# Patient Record
Sex: Female | Born: 2000 | Race: White | Hispanic: No | Marital: Single | Smoking: Never smoker
Health system: Southern US, Community
[De-identification: ages and names within clinical notes are randomized; demographics above are authoritative.]

## PROBLEM LIST (undated history)

## (undated) DIAGNOSIS — N926 Irregular menstruation, unspecified: Secondary | ICD-10-CM

## (undated) DIAGNOSIS — F329 Major depressive disorder, single episode, unspecified: Secondary | ICD-10-CM

## (undated) DIAGNOSIS — F909 Attention-deficit hyperactivity disorder, unspecified type: Secondary | ICD-10-CM

## (undated) DIAGNOSIS — F419 Anxiety disorder, unspecified: Secondary | ICD-10-CM

## (undated) DIAGNOSIS — K529 Noninfective gastroenteritis and colitis, unspecified: Secondary | ICD-10-CM

## (undated) DIAGNOSIS — F32A Depression, unspecified: Secondary | ICD-10-CM

## (undated) DIAGNOSIS — K2950 Unspecified chronic gastritis without bleeding: Secondary | ICD-10-CM

## (undated) DIAGNOSIS — R0683 Snoring: Secondary | ICD-10-CM

## (undated) DIAGNOSIS — E669 Obesity, unspecified: Secondary | ICD-10-CM

## (undated) DIAGNOSIS — K2980 Duodenitis without bleeding: Secondary | ICD-10-CM

## (undated) HISTORY — DX: Duodenitis without bleeding: K29.80

## (undated) HISTORY — DX: Snoring: R06.83

## (undated) HISTORY — DX: Noninfective gastroenteritis and colitis, unspecified: K52.9

## (undated) HISTORY — DX: Obesity, unspecified: E66.9

## (undated) HISTORY — DX: Major depressive disorder, single episode, unspecified: F32.9

## (undated) HISTORY — DX: Irregular menstruation, unspecified: N92.6

## (undated) HISTORY — DX: Attention-deficit hyperactivity disorder, unspecified type: F90.9

## (undated) HISTORY — DX: Depression, unspecified: F32.A

## (undated) HISTORY — DX: Anxiety disorder, unspecified: F41.9

## (undated) HISTORY — DX: Unspecified chronic gastritis without bleeding: K29.50

---
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
## 2019-01-07 ENCOUNTER — Telehealth: Payer: Managed Care, Other (non HMO) | Admitting: Family Medicine

## 2019-01-07 ENCOUNTER — Telehealth (INDEPENDENT_AMBULATORY_CARE_PROVIDER_SITE_OTHER): Payer: Managed Care, Other (non HMO) | Admitting: Family Medicine

## 2019-01-07 ENCOUNTER — Encounter: Payer: Self-pay | Admitting: Family Medicine

## 2019-01-07 VITALS — Wt 130.0 lb

## 2019-01-07 DIAGNOSIS — F900 Attention-deficit hyperactivity disorder, predominantly inattentive type: Secondary | ICD-10-CM | POA: Diagnosis not present

## 2019-01-07 MED ORDER — AMPHETAMINE-DEXTROAMPHETAMINE 10 MG PO TABS: 10.0000 mg | ORAL_TABLET | Freq: Two times a day (BID) | ORAL | 0 refills | Status: DC

## 2019-01-07 NOTE — Patient Instructions (Addendum)
Thank you for coming in today. Send me a mychart message in about 2 weeks with an update on how the new dose of adderall is going.  If we need to adjust the dose up or days. If all is well plan for repeat video visit with [facility] or other in 3 months.

## 2019-01-07 NOTE — Progress Notes (Signed)
Virtual Visit  via Video Note  I connected with      Sherry Yang by a video enabled telemedicine application and verified that I am speaking with the correct person using two identifiers.   I discussed the limitations of evaluation and management by telemedicine and the availability of in person appointments. The patient expressed understanding and agreed to proceed.  History of Present Illness: Sherry Yang is a 18 y.o. female who would like to discuss ADHD.  Patient has a history of ADHD managed with Vyvanse 30.  She notes his medication helps quite a bit but is very expensive.  She has to pay about $200 a month for this medication.  She notes that when she takes Vyvanse usually last about 8 hours.  She currently is attending college at [employer]  She is not had trials of other medications.  She is interested in possibly Adderall.     Observations/Objective: Wt 130 lb (59 kg)   BMI 22.31 kg/m  Wt Readings from Last 5 Encounters:  01/07/19 130 lb (59 kg) (55 %, Z= 0.14)*  11/30/18 128 lb (58.1 kg) (52 %, Z= 0.06)*  11/28/18 132 lb (59.9 kg) (59 %, Z= 0.24)*  11/06/18 130 lb (59 kg) (56 %, Z= 0.16)*  08/28/18 128 lb (58.1 kg) (53 %, Z= 0.09)*   * Growth percentiles are based on CDC (Girls, 2-20 Years) data.   Exam: Appearance nontoxic appearing Normal Speech.  Alert and oriented normal speech thought process.   Assessment and Plan: 19 y.o. female with ADHD.  Doing well with Vyvanse but it is too expensive.  Given her somewhat fragmented schedule as a college student she may benefit from immediate release form of Adderall.  Will try starting at 10 mg Adderall twice daily.  Patient will report back via mychart message in about 2 weeks.  Will adjust dose from there.  Hopefully will be able to refill this medication for total of 3 months.  She does have a follow-up appointment scheduled with Iran Planas PA on November 23.  She is seen Jade before and would  like to establish with her his PCP if able.   PDMP not reviewed this encounter. No orders of the defined types were placed in this encounter.  No orders of the defined types were placed in this encounter.   Follow Up Instructions:    I discussed the assessment and treatment plan with the patient. The patient was provided an opportunity to ask questions and all were answered. The patient agreed with the plan and demonstrated an understanding of the instructions.   The patient was advised to call back or seek an in-person evaluation if the symptoms worsen or if the condition fails to improve as anticipated.  Time: 15 minutes of intraservice time, with >22 minutes of total time during today's visit.      Historical information moved to improve visibility of documentation.  Past Medical History:  Diagnosis Date  . ADHD   . Anxiety   . Chronic gastritis   . Depression   . Duodenitis determined by biopsy   . Inflammatory bowel disease   . Irregular menses   . Obesity, pediatric   . Snoring    Past Surgical History:  Procedure Laterality Date  . COLONOSCOPY WITH ESOPHAGOGASTRODUODENOSCOPY (EGD)  05/2015   Social History   Tobacco Use  . Smoking status: Never Smoker  . Smokeless tobacco: Never Used  Substance Use Topics  . Alcohol  use: No   family history includes Colon polyps in her mother; Hypertension in her father; Irritable bowel syndrome in her father.  Medications: Current Outpatient Medications  Medication Sig Dispense Refill  . clindamycin (CLINDAGEL) 1 % gel Apply topically 2 (two) times daily.    Marland Kitchen ibuprofen (ADVIL) 800 MG tablet Take 1 tablet (800 mg total) by mouth every 8 (eight) hours as needed. 60 tablet 0  . lisdexamfetamine (VYVANSE) 30 MG capsule Take 1 capsule (30 mg total) by mouth daily. 30 capsule 0  . Peppermint Oil 50 MG CPDR Take by mouth.     No current facility-administered medications for this visit.    No Known Allergies

## 2019-02-01 ENCOUNTER — Telehealth: Payer: Self-pay | Admitting: Physician Assistant

## 2019-02-01 NOTE — Telephone Encounter (Signed)
Patient mom has called and the patient was switching form Vyvanse to Adderall and per mom the patient does not feel the 15 mg of adderall is working. She has virtual with you on 11/23. Mom is requesting a sooner appointment. Is this ok to change?   Mom is also requesting about an emotional support dog. Can you do this? Please advise.

## 2019-02-01 NOTE — Telephone Encounter (Signed)
Patient mom is aware that she will have a virtual visit on 02/02/2019 and 02/22/2019 will be cancelled.

## 2019-02-01 NOTE — Telephone Encounter (Signed)
[place] for virtual visit earlier so I can talk about both.

## 2019-02-02 ENCOUNTER — Ambulatory Visit (INDEPENDENT_AMBULATORY_CARE_PROVIDER_SITE_OTHER): Payer: Managed Care, Other (non HMO) | Admitting: Physician Assistant

## 2019-02-02 VITALS — Ht 64.0 in | Wt 130.0 lb

## 2019-02-02 DIAGNOSIS — F419 Anxiety disorder, unspecified: Secondary | ICD-10-CM

## 2019-02-02 DIAGNOSIS — F331 Major depressive disorder, recurrent, moderate: Secondary | ICD-10-CM

## 2019-02-02 DIAGNOSIS — F900 Attention-deficit hyperactivity disorder, predominantly inattentive type: Secondary | ICD-10-CM | POA: Diagnosis not present

## 2019-02-02 MED ORDER — AMPHETAMINE-DEXTROAMPHET ER 20 MG PO CP24: 20.0000 mg | ORAL_CAPSULE | ORAL | 0 refills | Status: DC

## 2019-02-02 NOTE — Progress Notes (Signed)
Patient ID: Sherry Yang, female   DOB: 02-14-2001, 18 y.o.   MRN: [id number] .Marland KitchenVirtual Visit via Telephone Note  I connected with Latanza Pfefferkorn Heckart on 02/02/19 at 10:50 AM EST by telephone and verified that I am speaking with the correct person using two identifiers.  Location: Patient: home Provider: clinic   I discussed the limitations, risks, security and privacy concerns of performing an evaluation and management service by telephone and the availability of in person appointments. I also discussed with the patient that there may be a patient responsible charge related to this service. The patient expressed understanding and agreed to proceed.   History of Present Illness: Patient is a 18 year old female with ADHD, depression, anxiety who presents to the clinic for follow-up.  She was recently seen by Dr. Denyse Amass who switched her from Vyvanse to Adderall due to cost issues.  She is not liking the immediate release Adderall.  She feels like it is creating more anxiety, difficulty sleeping, rashes.  She admits her anxiety was going on right before the medication change as well.  During the Covid pandemic she only has 1 in person class and she is at home by herself in her apartment for the other time.  She is becoming really lonely.  She would like to see about getting a support animal.  At home [place] lots of animals [place].  Here at school she does not have any support.  She denies any suicidal thoughts or homicidal idealizations.  She has been tried on anxiety medication in the past and she did not like the way it made her feel.  She admits she is not being active right now but she thinks if she gets a dog she will be more motivated to go for walks.   .. Active Ambulatory Problems    Diagnosis Date Noted  . Gastritis without bleeding 08/16/2015  . Duodenitis 07/05/2015  . IBD (inflammatory bowel disease) 03/30/2014  . Irregular menses 11/26/2014  . Moderate episode of recurrent  major depressive disorder (HCC) 11/26/2014  . Attention deficit hyperactivity disorder (ADHD), predominantly inattentive type 11/20/2016  . Chronic midline thoracic back pain 11/20/2016  . History of childhood obesity 11/20/2016  . History of syncope 03/07/2017  . Postural dizziness with near syncope 03/07/2017  . Acne vulgaris 03/07/2017  . Need for HPV vaccination 06/25/2017  . Axillary adenopathy 11/30/2018  . Anxiety 02/03/2019   Resolved Ambulatory Problems    Diagnosis Date Noted  . Esophagitis 07/05/2015  . Obesity 08/16/2015  . Encounter for medication management in attention deficit hyperactivity disorder (ADHD) 03/04/2017   Past Medical History:  Diagnosis Date  . ADHD   . Chronic gastritis   . Depression   . Duodenitis determined by biopsy   . Inflammatory bowel disease   . Obesity, pediatric   . Snoring    Reviewed med, allergy, problem list.    Observations/Objective: No acute distress. Normal judgement and conversation.   .. Today's Vitals   02/02/19 1002  Weight: 130 lb (59 kg)  Height: 5\' 4"  (1.626 m)   Body mass index is 22.31 kg/m.   .. Depression screen [facility] 2/9 02/02/2019 08/28/2018 06/25/2017 11/20/2016  Decreased Interest 0 0 0 3  Down, Depressed, Hopeless 0 0 0 1  PHQ - 2 Score 0 0 0 4  Altered sleeping - 0 1 2  Tired, decreased energy - 0 0 1  Change in appetite - 0 0 0  Feeling bad or  failure about yourself  - 0 0 0  Trouble concentrating - 0 0 0  Moving slowly or fidgety/restless - 0 0 0  Suicidal thoughts - 0 0 0  PHQ-9 Score - 0 1 7  Difficult doing work/chores - - Not difficult at all -   .. GAD 7 : Generalized Anxiety Score 02/02/2019 08/28/2018  Nervous, Anxious, on Edge 3 0  Control/stop worrying 2 0  Worry too much - different things 3 0  Trouble relaxing 3 0  Restless 3 0  Easily annoyed or irritable 2 0  Afraid - awful might happen 0 0  Total GAD 7 Score 16 0  Anxiety Difficulty Not difficult at all -      Assessment  and Plan: Marland KitchenMarland KitchenMakynlee was seen today for adhd and anxiety.  Diagnoses and all orders for this visit:  Moderate episode of recurrent major depressive disorder (HCC)  Attention deficit hyperactivity disorder (ADHD), predominantly inattentive type -     amphetamine-dextroamphetamine (ADDERALL XR) 20 MG 24 hr capsule; Take 1 capsule (20 mg total) by mouth every morning.  Anxiety   PHQ-9 and GAD-7 scores were elevated.  I do think patient would benefit from an emotional support animal.  I wrote letter today.  We will stop immediate release Adderall.  We will try extended release 20 mg.  She will monitor her side effects and symptoms closely.  She could also call [place] and see if they have any patient assistance programs or coupon cards to make affordable.   Follow up in 3 weeks.   Follow Up Instructions:    I discussed the assessment and treatment plan with the patient. The patient was provided an opportunity to ask questions and all were answered. The patient agreed with the plan and demonstrated an understanding of the instructions.   The patient was advised to call back or seek an in-person evaluation if the symptoms worsen or if the condition fails to improve as anticipated.  I provided 15 minutes of non-face-to-face time during this encounter and total of 25 minutes of care/managing treatment plan/letters/medication refills.    Iran Planas, PA-C

## 2019-02-02 NOTE — Progress Notes (Signed)
Patient is living at school in an apartment by herself, classes are online, so having trouble with anxiety/loneliness.  She is used [place] living on farm at home with a lot of animals.  PHQ9-GAD7 completed.

## 2019-02-03 DIAGNOSIS — F419 Anxiety disorder, unspecified: Secondary | ICD-10-CM | POA: Insufficient documentation

## 2019-02-22 ENCOUNTER — Ambulatory Visit: Payer: Managed Care, Other (non HMO) | Admitting: Physician Assistant

## 2019-03-02 ENCOUNTER — Inpatient Hospital Stay: Admission: RE | Admit: 2019-03-02 | Payer: Managed Care, Other (non HMO) | Source: Ambulatory Visit

## 2019-03-02 ENCOUNTER — Other Ambulatory Visit: Payer: Managed Care, Other (non HMO)

## 2019-03-17 ENCOUNTER — Telehealth (INDEPENDENT_AMBULATORY_CARE_PROVIDER_SITE_OTHER): Payer: Managed Care, Other (non HMO) | Admitting: Physician Assistant

## 2019-03-17 ENCOUNTER — Encounter: Payer: Self-pay | Admitting: Physician Assistant

## 2019-03-17 VITALS — Ht 64.0 in | Wt 130.0 lb

## 2019-03-17 DIAGNOSIS — F900 Attention-deficit hyperactivity disorder, predominantly inattentive type: Secondary | ICD-10-CM

## 2019-03-17 DIAGNOSIS — F331 Major depressive disorder, recurrent, moderate: Secondary | ICD-10-CM | POA: Diagnosis not present

## 2019-03-17 MED ORDER — LISDEXAMFETAMINE DIMESYLATE 30 MG PO CAPS: 30.0000 mg | ORAL_CAPSULE | Freq: Every day | ORAL | 0 refills | Status: DC

## 2019-03-17 MED ORDER — LISDEXAMFETAMINE DIMESYLATE 30 MG PO CAPS: 30.0000 mg | ORAL_CAPSULE | ORAL | 0 refills | Status: DC

## 2019-03-17 NOTE — Progress Notes (Signed)
Patient wants to switch Adderall back to Vyvanse, she feels like it isn't as effective.

## 2019-03-17 NOTE — Progress Notes (Signed)
Patient ID: Sherry Yang, female   DOB: 2001-01-25, 18 y.o.   MRN: [id number] .Marland KitchenVirtual Visit via Video Note  I connected with Danee Soller Allor on 03/17/19 at  2:20 PM EST by a video enabled telemedicine application and verified that I am speaking with the correct person using two identifiers.  Location: Patient: home Provider: clinic   I discussed the limitations of evaluation and management by telemedicine and the availability of in person appointments. The patient expressed understanding and agreed to proceed.  History of Present Illness: Pt is a 18 yo female with [name], ADHD who calls into the clinic for follow up.   She is not doing well with adderall. She has tried both IR and XR. She found coupons for vyvanse and would like to go back on that. She was controlled for years until cost was an issue.   She denies any problems with depression or anxiety.   .. Active Ambulatory Problems    Diagnosis Date Noted  . Gastritis without bleeding 08/16/2015  . Duodenitis 07/05/2015  . IBD (inflammatory bowel disease) 03/30/2014  . Irregular menses 11/26/2014  . Moderate episode of recurrent major depressive disorder (HCC) 11/26/2014  . Attention deficit hyperactivity disorder (ADHD), predominantly inattentive type 11/20/2016  . Chronic midline thoracic back pain 11/20/2016  . History of childhood obesity 11/20/2016  . History of syncope 03/07/2017  . Postural dizziness with near syncope 03/07/2017  . Acne vulgaris 03/07/2017  . Need for HPV vaccination 06/25/2017  . Axillary adenopathy 11/30/2018  . Anxiety 02/03/2019   Resolved Ambulatory Problems    Diagnosis Date Noted  . Esophagitis 07/05/2015  . Obesity 08/16/2015  . Encounter for medication management in attention deficit hyperactivity disorder (ADHD) 03/04/2017   Past Medical History:  Diagnosis Date  . ADHD   . Chronic gastritis   . Depression   . Duodenitis determined by biopsy   . Inflammatory bowel disease   .  Obesity, pediatric   . Snoring    Reviewed med, allergy, problem list.     Observations/Objective: No acute distress.  Normal appearance and mood.  Normal breathing.   .. Today's Vitals   03/17/19 0944  Weight: 130 lb (59 kg)  Height: 5\' 4"  (1.626 m)   Body mass index is 22.31 kg/m.   .. Depression screen [facility] 2/9 03/17/2019 02/02/2019 08/28/2018 06/25/2017 11/20/2016  Decreased Interest 0 0 0 0 3  Down, Depressed, Hopeless 0 0 0 0 1  PHQ - 2 Score 0 0 0 0 4  Altered sleeping - - 0 1 2  Tired, decreased energy - - 0 0 1  Change in appetite - - 0 0 0  Feeling bad or failure about yourself  - - 0 0 0  Trouble concentrating - - 0 0 0  Moving slowly or fidgety/restless - - 0 0 0  Suicidal thoughts - - 0 0 0  PHQ-9 Score - - 0 1 7  Difficult doing work/chores - - - Not difficult at all -   .. GAD 7 : Generalized Anxiety Score 03/17/2019 02/02/2019 08/28/2018  Nervous, Anxious, on Edge 1 3 0  Control/stop worrying 0 2 0  Worry too much - different things 1 3 0  Trouble relaxing 1 3 0  Restless 1 3 0  Easily annoyed or irritable 0 2 0  Afraid - awful might happen 0 0 0  Total GAD 7 Score 4 16 0  Anxiety Difficulty Not difficult at all Not difficult at all -  Assessment and Plan: Marland KitchenMarland KitchenShamira was seen today for depression.  Diagnoses and all orders for this visit:  Moderate episode of recurrent major depressive disorder (HCC)  Attention deficit hyperactivity disorder (ADHD), predominantly inattentive type -     lisdexamfetamine (VYVANSE) 30 MG capsule; Take 1 capsule (30 mg total) by mouth every morning. -     lisdexamfetamine (VYVANSE) 30 MG capsule; Take 1 capsule (30 mg total) by mouth daily. -     lisdexamfetamine (VYVANSE) 30 MG capsule; Take 1 capsule (30 mg total) by mouth daily.   PHQ great. Changed adderall back to vyvanse. Sent for 3 months. Call with any problems or concerns.    Follow Up Instructions:    I discussed the assessment and treatment plan  with the patient. The patient was provided an opportunity to ask questions and all were answered. The patient agreed with the plan and demonstrated an understanding of the instructions.   The patient was advised to call back or seek an in-person evaluation if the symptoms worsen or if the condition fails to improve as anticipated.   Iran Planas, PA-C

## 2019-03-19 ENCOUNTER — Encounter: Payer: Self-pay | Admitting: Physician Assistant

## 2019-04-21 ENCOUNTER — Encounter: Payer: Self-pay | Admitting: Physician Assistant

## 2019-04-21 ENCOUNTER — Telehealth (INDEPENDENT_AMBULATORY_CARE_PROVIDER_SITE_OTHER): Payer: Managed Care, Other (non HMO) | Admitting: Physician Assistant

## 2019-04-21 ENCOUNTER — Other Ambulatory Visit: Payer: Self-pay | Admitting: Physician Assistant

## 2019-04-21 VITALS — Ht 64.0 in | Wt 140.0 lb

## 2019-04-21 DIAGNOSIS — L700 Acne vulgaris: Secondary | ICD-10-CM | POA: Diagnosis not present

## 2019-04-21 DIAGNOSIS — Z3009 Encounter for other general counseling and advice on contraception: Secondary | ICD-10-CM | POA: Diagnosis not present

## 2019-04-21 MED ORDER — NORGESTIM-ETH ESTRAD TRIPHASIC 0.18/0.215/0.25 MG-25 MCG PO TABS: 1.0000 | ORAL_TABLET | Freq: Every day | ORAL | 3 refills | Status: DC

## 2019-04-21 NOTE — Progress Notes (Signed)
Wants to discuss birth control options for contraception. She states no issues with periods. No other issues.

## 2019-04-21 NOTE — Progress Notes (Addendum)
Patient ID: Sherry Yang, female   DOB: Aug 05, 2000, 19 y.o.   MRN: [id number] .Marland KitchenVirtual Visit via Video Note  I connected with Sherry Yang on 04/21/19 at  9:50 AM EST by a video enabled telemedicine application and verified that I am speaking with the correct person using two identifiers.  Location: Patient: home Provider: clinic   I discussed the limitations of evaluation and management by telemedicine and the availability of in person appointments. The patient expressed understanding and agreed to proceed.  History of Present Illness: Pt is a 19 yo female who calls into the clinic to discuss OCP. She would like something for pregnancy prevention and to help with acne. She has been on OCP before. She is considering accuntane but will need OCP. She is sexually active and uses condoms. She has tolerated OcP in past.   .. Active Ambulatory Problems    Diagnosis Date Noted  . Gastritis without bleeding 08/16/2015  . Duodenitis 07/05/2015  . IBD (inflammatory bowel disease) 03/30/2014  . Irregular menses 11/26/2014  . Moderate episode of recurrent major depressive disorder (HCC) 11/26/2014  . Attention deficit hyperactivity disorder (ADHD), predominantly inattentive type 11/20/2016  . Chronic midline thoracic back pain 11/20/2016  . History of childhood obesity 11/20/2016  . History of syncope 03/07/2017  . Postural dizziness with near syncope 03/07/2017  . Acne vulgaris 03/07/2017  . Need for HPV vaccination 06/25/2017  . Axillary adenopathy 11/30/2018  . Anxiety 02/03/2019   Resolved Ambulatory Problems    Diagnosis Date Noted  . Esophagitis 07/05/2015  . Obesity 08/16/2015  . Encounter for medication management in attention deficit hyperactivity disorder (ADHD) 03/04/2017   Past Medical History:  Diagnosis Date  . ADHD   . Chronic gastritis   . Depression   . Duodenitis determined by biopsy   . Inflammatory bowel disease   . Obesity, pediatric   . Snoring    Reviewed  med, allergies, problem list.     Observations/Objective: No acute distress.  Normal mood and appearance.   .. Today's Vitals   04/21/19 0923  Weight: 140 lb (63.5 kg)  Height: 5\' 4"  (1.626 m)   Body mass index is 24.03 kg/m.    Assessment and Plan: Marland KitchenKilyn was seen today for contraception.  Diagnoses and all orders for this visit:  Birth control counseling -     Norgestimate-Ethinyl Estradiol Triphasic 0.18/0.215/0.25 MG-25 MCG tab; Take 1 tablet by mouth daily.  Acne vulgaris -     Norgestimate-Ethinyl Estradiol Triphasic 0.18/0.215/0.25 MG-25 MCG tab; Take 1 tablet by mouth daily.  Other orders -     Discontinue: Norgestimate-Ethinyl Estradiol Triphasic 0.18/0.215/0.25 MG-25 MCG tab; Take 1 tablet by mouth daily.   Pap not needed.  Using condoms. STD screening need yearly.  After next bleed start OCP.  Follow up in 1 year.   Spent 9 minutes with patient and chart review.   Follow Up Instructions:    I discussed the assessment and treatment plan with the patient. The patient was provided an opportunity to ask questions and all were answered. The patient agreed with the plan and demonstrated an understanding of the instructions.   The patient was advised to call back or seek an in-person evaluation if the symptoms worsen or if the condition fails to improve as anticipated.   04-29-1981, PA-C

## 2019-07-05 ENCOUNTER — Telehealth (INDEPENDENT_AMBULATORY_CARE_PROVIDER_SITE_OTHER): Payer: Managed Care, Other (non HMO) | Admitting: Physician Assistant

## 2019-07-05 ENCOUNTER — Encounter: Payer: Self-pay | Admitting: Physician Assistant

## 2019-07-05 DIAGNOSIS — F900 Attention-deficit hyperactivity disorder, predominantly inattentive type: Secondary | ICD-10-CM | POA: Diagnosis not present

## 2019-07-05 MED ORDER — LISDEXAMFETAMINE DIMESYLATE 40 MG PO CAPS: 40.0000 mg | ORAL_CAPSULE | ORAL | 0 refills | Status: DC

## 2019-07-05 NOTE — Progress Notes (Signed)
Patient ID: Sherry Yang, female   DOB: 2001/01/09, 19 y.o.   MRN: [id number] .Marland KitchenVirtual Visit via Video Note  I connected with Sherry Yang on 07/05/19 at  8:50 AM EDT by a video enabled telemedicine application and verified that I am speaking with the correct person using two identifiers.  Location: Patient: car Provider: clinic   I discussed the limitations of evaluation and management by telemedicine and the availability of in person appointments. The patient expressed understanding and agreed to proceed.  History of Present Illness: Pt is a 19 yo female with ADHD and needs medication refill. Pt is doing well. No side effects. No CP, palpitations, headaches, or insomnia. She does feel like it wears off too soon. She wonders about a increase. She feels like in general online classes are much harder to focus.   .. Active Ambulatory Problems    Diagnosis Date Noted  . Gastritis without bleeding 08/16/2015  . Duodenitis 07/05/2015  . IBD (inflammatory bowel disease) 03/30/2014  . Irregular menses 11/26/2014  . Moderate episode of recurrent major depressive disorder (HCC) 11/26/2014  . Attention deficit hyperactivity disorder (ADHD), predominantly inattentive type 11/20/2016  . Chronic midline thoracic back pain 11/20/2016  . History of childhood obesity 11/20/2016  . History of syncope 03/07/2017  . Postural dizziness with near syncope 03/07/2017  . Acne vulgaris 03/07/2017  . Need for HPV vaccination 06/25/2017  . Axillary adenopathy 11/30/2018  . Anxiety 02/03/2019   Resolved Ambulatory Problems    Diagnosis Date Noted  . Esophagitis 07/05/2015  . Obesity 08/16/2015  . Encounter for medication management in attention deficit hyperactivity disorder (ADHD) 03/04/2017   Past Medical History:  Diagnosis Date  . ADHD   . Chronic gastritis   . Depression   . Duodenitis determined by biopsy   . Inflammatory bowel disease   . Obesity, pediatric   . Snoring    Reviewed med,  allergy, problem list.     Observations/Objective: No acute distress. Normal mood and appearance.   .. Today's Vitals   07/05/19 0844  Weight: 138 lb (62.6 kg)  Height: 5\' 4"  (1.626 m)   Body mass index is 23.69 kg/m.    Assessment and Plan: Marland KitchenDamonique was seen today for adhd.  Diagnoses and all orders for this visit:  Attention deficit hyperactivity disorder (ADHD), predominantly inattentive type -     lisdexamfetamine (VYVANSE) 40 MG capsule; Take 1 capsule (40 mg total) by mouth every morning. -     lisdexamfetamine (VYVANSE) 40 MG capsule; Take 1 capsule (40 mg total) by mouth every morning. -     lisdexamfetamine (VYVANSE) 40 MG capsule; Take 1 capsule (40 mg total) by mouth every morning.   Increased vyvanse to 40mg . Discussed other causes of focus issues. Make sure exercising daily. Pt declines any side effects. Follow up in 38months.    Follow Up Instructions:    I discussed the assessment and treatment plan with the patient. The patient was provided an opportunity to ask questions and all were answered. The patient agreed with the plan and demonstrated an understanding of the instructions.   The patient was advised to call back or seek an in-person evaluation if the symptoms worsen or if the condition fails to improve as anticipated.  I provided 11 minutes of non-face-to-face time during this encounter.   , PA-C

## 2019-07-05 NOTE — Progress Notes (Signed)
Wants to discuss increasing Vyvanse dosage, having issues with focus  Feels like not working as well and wears off faster Issues with online classes

## 2019-07-27 ENCOUNTER — Telehealth: Payer: Self-pay

## 2019-07-27 DIAGNOSIS — F900 Attention-deficit hyperactivity disorder, predominantly inattentive type: Secondary | ICD-10-CM

## 2019-07-27 NOTE — Telephone Encounter (Signed)
Stephenie's mom called and requested the Vyvanse prescription to be sent to a different pharmacy. I called and left a message with the patient to return my call to confirm she was wanting the prescriptions of Vyvanse sent to a different pharmacy.   [facility] [place], [place]

## 2019-07-28 MED ORDER — LISDEXAMFETAMINE DIMESYLATE 40 MG PO CAPS: 40.0000 mg | ORAL_CAPSULE | ORAL | 0 refills | Status: DC

## 2019-07-28 NOTE — Telephone Encounter (Signed)
Patient advised.

## 2019-07-28 NOTE — Telephone Encounter (Signed)
Meds sent

## 2019-07-28 NOTE — Telephone Encounter (Signed)
Baxter Hire called back and left a message stating she does want the Vyvanse prescriptions sent to the [facility] in [place], [place]. The cost is much cheaper at the specialty pharmacy. I have cancelled the May and June prescriptions with the other pharmacy.

## 2019-09-17 ENCOUNTER — Other Ambulatory Visit: Payer: Self-pay

## 2019-09-17 DIAGNOSIS — F900 Attention-deficit hyperactivity disorder, predominantly inattentive type: Secondary | ICD-10-CM

## 2019-09-17 MED ORDER — LISDEXAMFETAMINE DIMESYLATE 40 MG PO CAPS: 40.0000 mg | ORAL_CAPSULE | ORAL | 0 refills | Status: DC

## 2019-09-21 ENCOUNTER — Encounter: Payer: Self-pay | Admitting: Physician Assistant

## 2019-09-21 ENCOUNTER — Ambulatory Visit (INDEPENDENT_AMBULATORY_CARE_PROVIDER_SITE_OTHER): Payer: Managed Care, Other (non HMO) | Admitting: Physician Assistant

## 2019-09-21 VITALS — BP 118/66 | HR 60 | Wt 141.0 lb

## 2019-09-21 DIAGNOSIS — F411 Generalized anxiety disorder: Secondary | ICD-10-CM | POA: Insufficient documentation

## 2019-09-21 DIAGNOSIS — G479 Sleep disorder, unspecified: Secondary | ICD-10-CM

## 2019-09-21 DIAGNOSIS — F900 Attention-deficit hyperactivity disorder, predominantly inattentive type: Secondary | ICD-10-CM

## 2019-09-21 DIAGNOSIS — F339 Major depressive disorder, recurrent, unspecified: Secondary | ICD-10-CM

## 2019-09-21 MED ORDER — LISDEXAMFETAMINE DIMESYLATE 40 MG PO CAPS: 40.0000 mg | ORAL_CAPSULE | ORAL | 0 refills | Status: DC

## 2019-09-21 MED ORDER — BUSPIRONE HCL 7.5 MG PO TABS: 7.5000 mg | ORAL_TABLET | Freq: Two times a day (BID) | ORAL | 2 refills | Status: DC

## 2019-09-21 NOTE — Patient Instructions (Signed)

## 2019-09-21 NOTE — Progress Notes (Addendum)
Subjective:    Patient ID: Sherry Yang, female    DOB: 09-01-2000, 19 y.o.   MRN: [id number]  HPI  Shinika Estelle is a 19 year old female presenting for ADHD medication refill. Patient is doing well on current medication. Denies side effects. Denies CP, palpitations, headaches. She states her focus has been much better on 40MG .  Toba endorses increased anxiety and depression. She states it has been especially hard when she is away at school at [employer] which is far from her family. She also endorses she has been working 50+ hours at [employer] a week which has been stressful. She states she has tried medication in the past, approximately 4 years ago. She has tried Zoloft, Lexapro, and Prozac. She denies that any of those helped significantly and she had some weight gain with one of them. She endorses it is more anxiety that is a concern which leads to a depressed mood. Denies SI/HI. Has tried counseling in the past but states she is not interested in therapy at this time.   Pt endorses she has difficulty falling asleep, admits it could be due to taking her vyvanse later than she should. Endorses restless leg symptoms at night too.    .. Depression screen [facility] 2/9 09/21/2019 07/05/2019 03/17/2019 02/02/2019 08/28/2018  Decreased Interest 2 0 0 0 0  Down, Depressed, Hopeless 2 0 0 0 0  PHQ - 2 Score 4 0 0 0 0  Altered sleeping 3 1 - - 0  Tired, decreased energy 3 1 - - 0  Change in appetite 3 0 - - 0  Feeling bad or failure about yourself  0 0 - - 0  Trouble concentrating 2 3 - - 0  Moving slowly or fidgety/restless 0 1 - - 0  Suicidal thoughts 0 0 - - 0  PHQ-9 Score 15 6 - - 0  Difficult doing work/chores Very difficult Somewhat difficult - - -   .08/30/2018 GAD 7 : Generalized Anxiety Score 09/21/2019 07/05/2019 03/17/2019 02/02/2019  Nervous, Anxious, on Edge 3 0 1 3  Control/stop worrying 3 0 0 2  Worry too much - different things 3 0 1 3  Trouble relaxing 3 0 1 3  Restless 3 0 1 3  Easily annoyed  or irritable 3 0 0 2  Afraid - awful might happen 1 0 0 0  Total GAD 7 Score 19 0 4 16  Anxiety Difficulty Very difficult Not difficult at all Not difficult at all Not difficult at all      Review of Systems  Constitutional: Negative.  Negative for activity change and appetite change.  HENT: Negative.   Eyes: Negative.   Respiratory: Negative.  Negative for shortness of breath.   Cardiovascular: Negative.  Negative for chest pain and palpitations.  Gastrointestinal: Negative.   Genitourinary: Negative.   Neurological: Negative.   Psychiatric/Behavioral: Positive for dysphoric mood and sleep disturbance. Negative for decreased concentration and suicidal ideas. The patient is nervous/anxious.          Objective:   Physical Exam Constitutional:      Appearance: Normal appearance. She is normal weight.  HENT:     Head: Normocephalic and atraumatic.  Cardiovascular:     Rate and Rhythm: Normal rate and regular rhythm.     Pulses: Normal pulses.     Heart sounds: Normal heart sounds.  Pulmonary:     Effort: Pulmonary effort is normal.     Breath sounds: Normal breath sounds.  Musculoskeletal:  Cervical back: Normal range of motion.  Skin:    General: Skin is warm and dry.  Neurological:     General: No focal deficit present.     Mental Status: She is alert and oriented to person, place, and time.  Psychiatric:        Attention and Perception: Attention normal.        Mood and Affect: Affect normal. Mood is anxious and depressed.        Speech: Speech normal.        Behavior: Behavior normal.        Thought Content: Thought content normal. Thought content does not include homicidal or suicidal ideation.         Assessment & Plan:  Marland KitchenMarland KitchenTelma was seen today for follow-up.  Diagnoses and all orders for this visit:  GAD (generalized anxiety disorder) -     busPIRone (BUSPAR) 7.5 MG tablet; Take 1 tablet (7.5 mg total) by mouth 2 (two) times daily.  Attention  deficit hyperactivity disorder (ADHD), predominantly inattentive type -     Discontinue: lisdexamfetamine (VYVANSE) 40 MG capsule; Take 1 capsule (40 mg total) by mouth every morning. -     Discontinue: lisdexamfetamine (VYVANSE) 40 MG capsule; Take 1 capsule (40 mg total) by mouth every morning. -     Discontinue: lisdexamfetamine (VYVANSE) 40 MG capsule; Take 1 capsule (40 mg total) by mouth every morning. -     lisdexamfetamine (VYVANSE) 40 MG capsule; Take 1 capsule (40 mg total) by mouth every morning. -     lisdexamfetamine (VYVANSE) 40 MG capsule; Take 1 capsule (40 mg total) by mouth every morning. -     lisdexamfetamine (VYVANSE) 40 MG capsule; Take 1 capsule (40 mg total) by mouth every morning.  Depression, recurrent ([place])    - Continue Vyvanse 40MG  daily  - Has tried Zoloft, Lexapro or Prozac. Denies improvement to mood/anxiety with those. - Start 7.5MG  buspar BID for anxiety.  -encouraged meditation, regular exercise, and good sleep.    Discussed limiting caffeine, taking Vyvanse earlier. Discussed trying Unisom over the counter to aid with sleep, as well as sleep hygiene.  Patient is sexually active, declines STI testing today.  Follow up in 3 months or sooner if needed.    Marland KitchenVernetta Honey PA-C, have reviewed and agree with the above documentation in it's entirety.

## 2019-10-13 ENCOUNTER — Other Ambulatory Visit: Payer: Self-pay | Admitting: Physician Assistant

## 2019-10-13 DIAGNOSIS — F411 Generalized anxiety disorder: Secondary | ICD-10-CM

## 2020-01-16 IMAGING — DX CHEST - 2 VIEW
2 series · 2 of 2 positions shown · non-contrast
Comparison: None.

CLINICAL DATA: Cough and axillary lymph node swelling

EXAM:
CHEST - 2 VIEW

[chest pa]
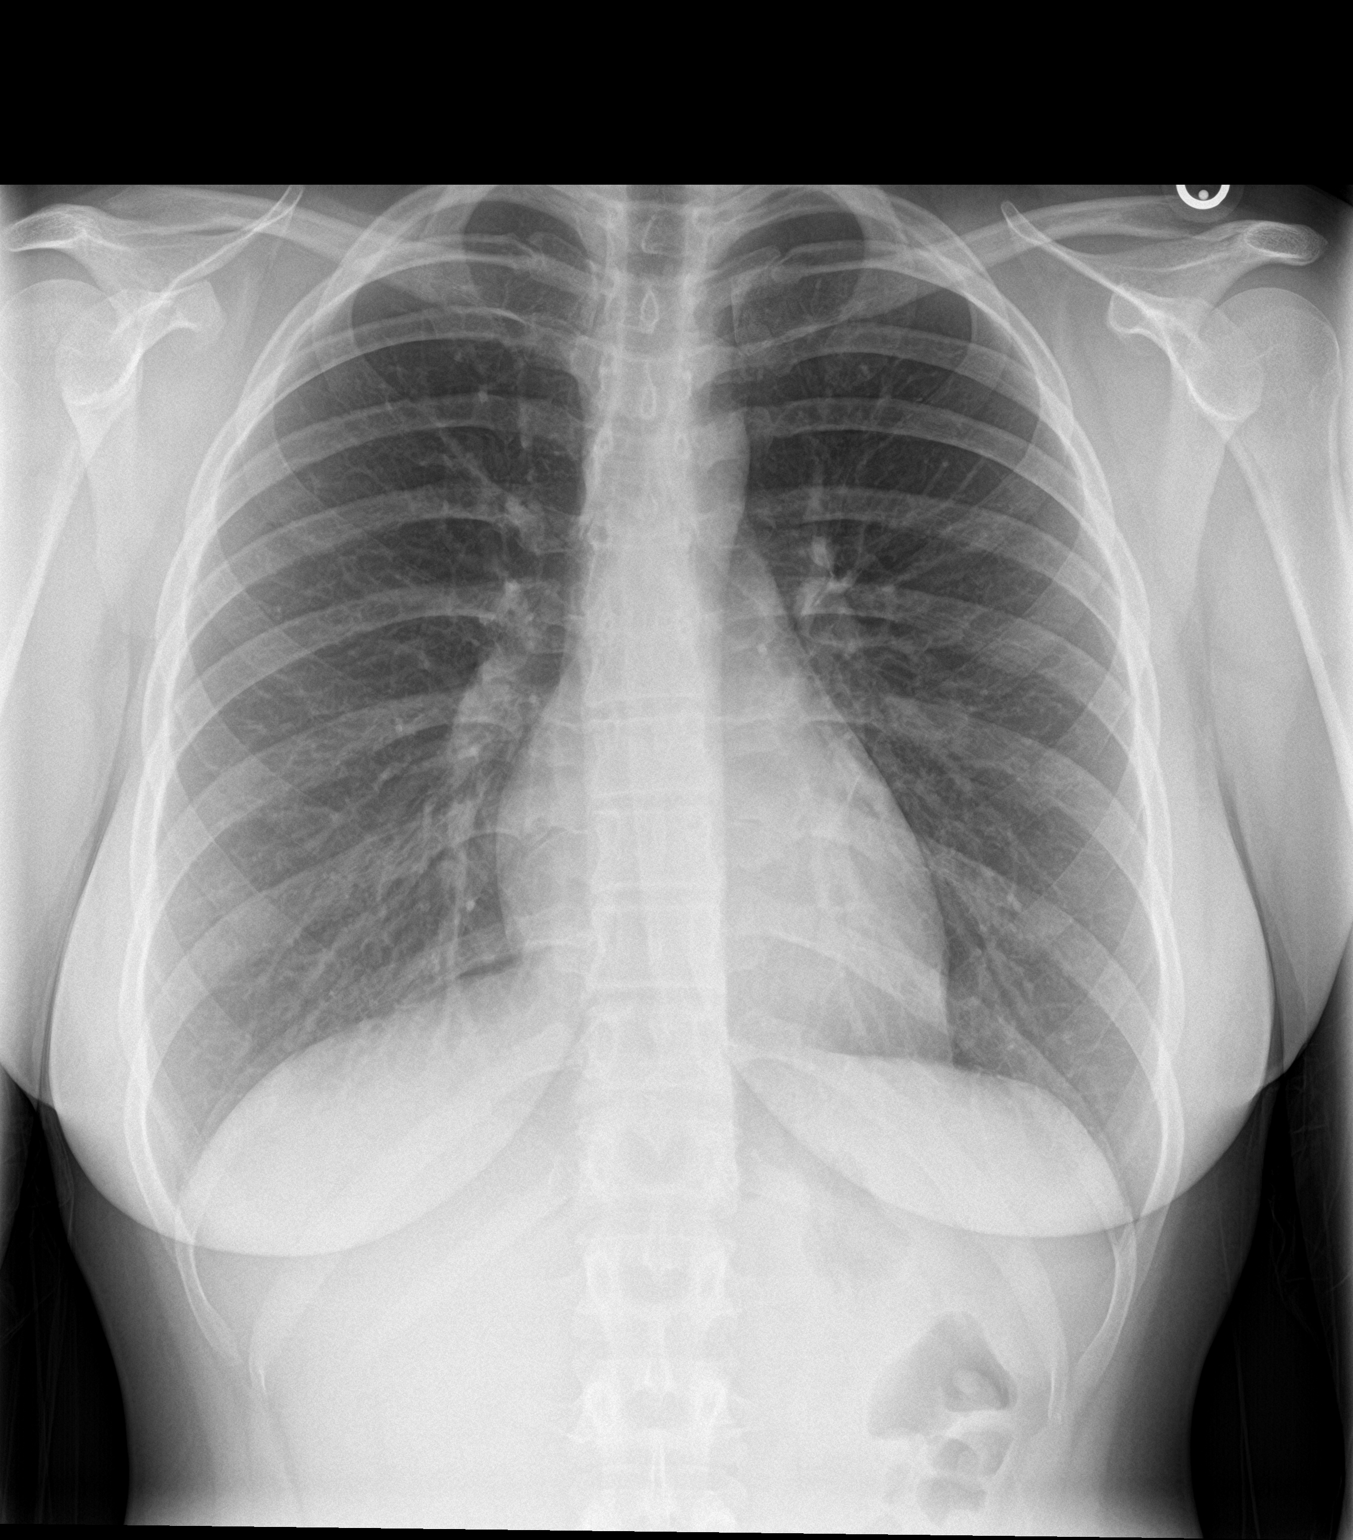

[chest lat]
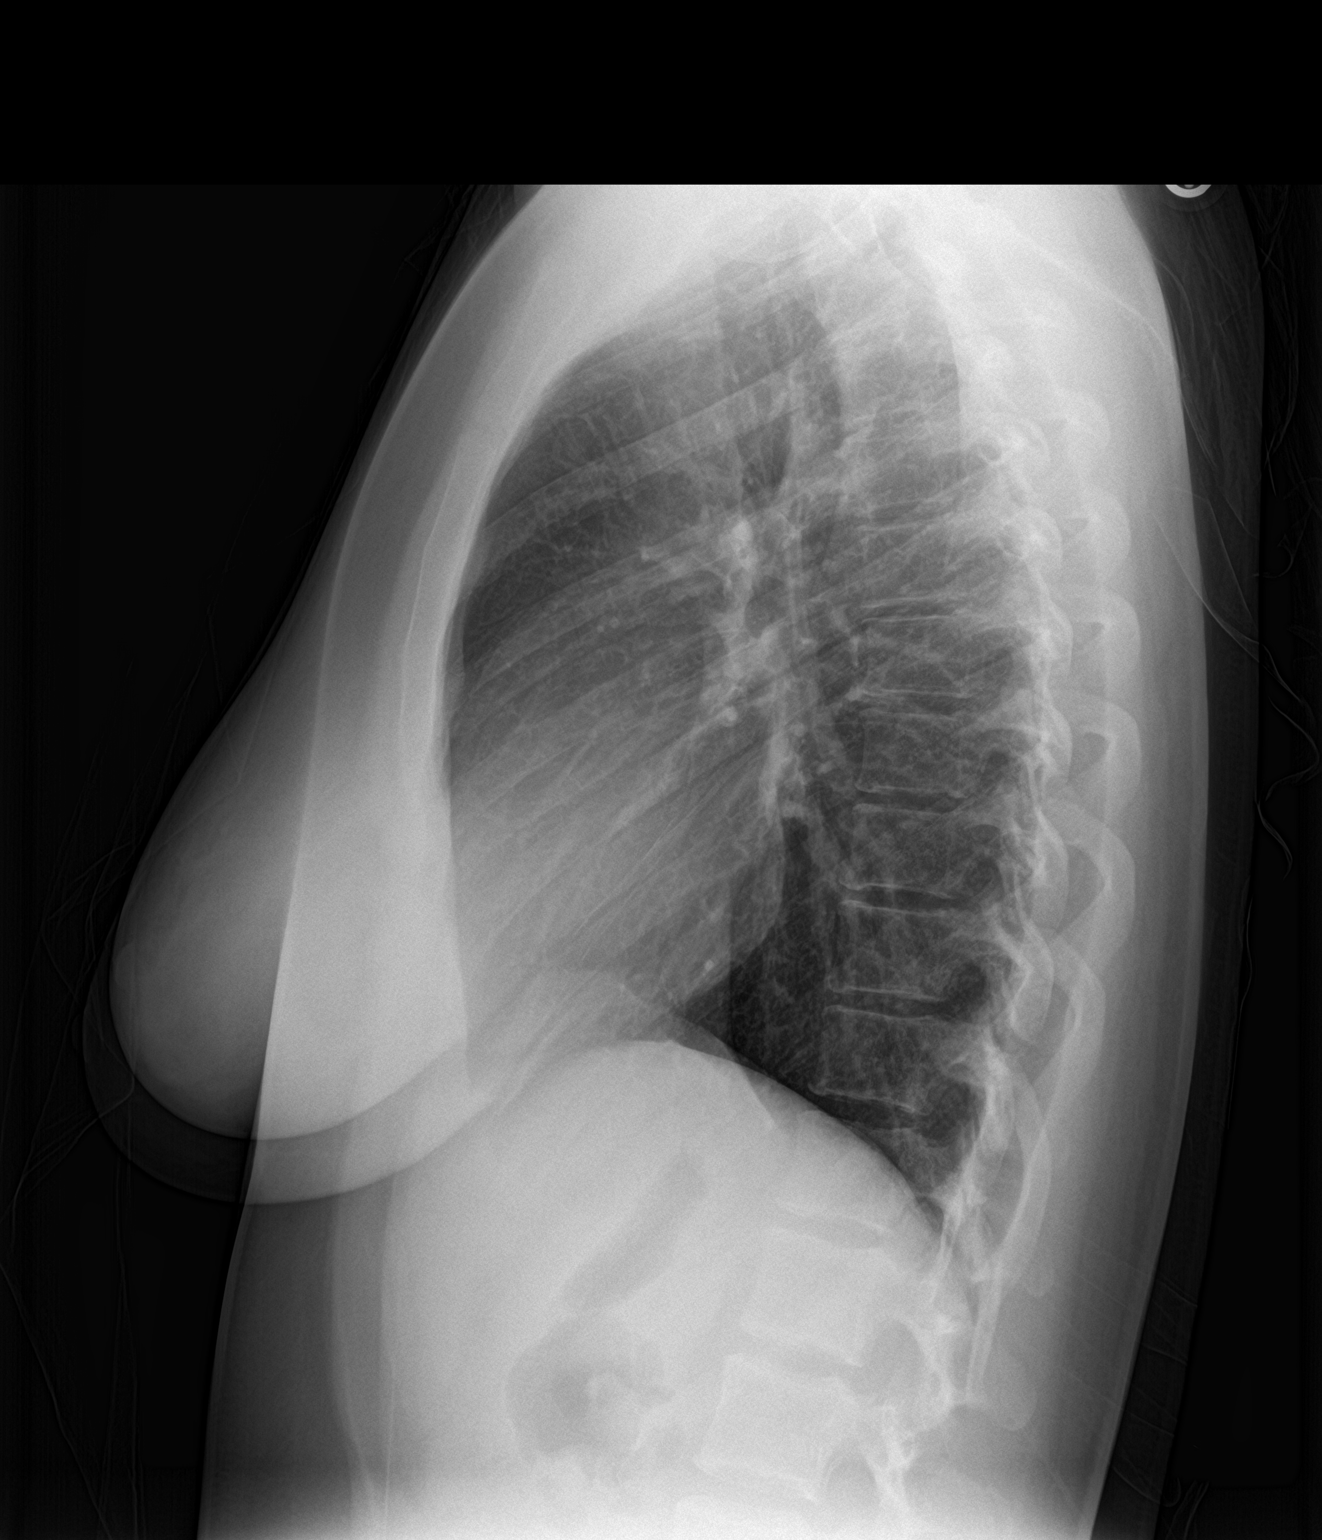

[2 of 2 positions shown; findings below may reference images not displayed]

FINDINGS: Lungs are clear. Heart size and pulmonary vascularity are normal. No
adenopathy demonstrable by radiography. No bone lesions.
IMPRESSION: No abnormality noted. In particular, no adenopathy by radiography
apparent.

## 2020-01-25 ENCOUNTER — Telehealth: Payer: Self-pay | Admitting: Neurology

## 2020-01-25 NOTE — Telephone Encounter (Signed)
Received vm from [phone] to authenticate the emotional support animal letter written last November. Called and left vm authenticating letter. TO call back with any problems.

## 2020-02-07 ENCOUNTER — Other Ambulatory Visit: Payer: Self-pay | Admitting: Physician Assistant

## 2020-02-07 DIAGNOSIS — F900 Attention-deficit hyperactivity disorder, predominantly inattentive type: Secondary | ICD-10-CM

## 2020-02-08 ENCOUNTER — Telehealth (INDEPENDENT_AMBULATORY_CARE_PROVIDER_SITE_OTHER): Payer: Managed Care, Other (non HMO) | Admitting: Physician Assistant

## 2020-02-08 ENCOUNTER — Encounter: Payer: Self-pay | Admitting: Physician Assistant

## 2020-02-08 DIAGNOSIS — F900 Attention-deficit hyperactivity disorder, predominantly inattentive type: Secondary | ICD-10-CM

## 2020-02-08 MED ORDER — LISDEXAMFETAMINE DIMESYLATE 40 MG PO CAPS: 40.0000 mg | ORAL_CAPSULE | ORAL | 0 refills | Status: DC

## 2020-02-08 NOTE — Progress Notes (Signed)
Patient ID: Sherry Yang, female   DOB: October 08, 2000, 19 y.o.   MRN: [id number] .Sherry KitchenVirtual Visit via Video Note  I connected with Shanteria Laye Yang on 02/08/20 at  8:50 AM EST by a video enabled telemedicine application and verified that I am speaking with the correct person using two identifiers.  Location: Patient: dorm room Provider: clinic   I discussed the limitations of evaluation and management by telemedicine and the availability of in person appointments. The patient expressed understanding and agreed to proceed.  History of Present Illness: Pt is a 19 yo female with ADHD she comes in for refills.   Pt is doing great on vyvanse 40mg  daily. She has no concerns or complaints. Denies any insomnia, palpitations, headaches, or increase in anxiety. She is doing well in school and with work.   .. Active Ambulatory Problems    Diagnosis Date Noted  . Gastritis without bleeding 08/16/2015  . Duodenitis 07/05/2015  . IBD (inflammatory bowel disease) 03/30/2014  . Irregular menses 11/26/2014  . Moderate episode of recurrent major depressive disorder (HCC) 11/26/2014  . Attention deficit hyperactivity disorder (ADHD), predominantly inattentive type 11/20/2016  . Chronic midline thoracic back pain 11/20/2016  . History of childhood obesity 11/20/2016  . History of syncope 03/07/2017  . Postural dizziness with near syncope 03/07/2017  . Acne vulgaris 03/07/2017  . Need for HPV vaccination 06/25/2017  . Axillary adenopathy 11/30/2018  . Anxiety 02/03/2019  . GAD (generalized anxiety disorder) 09/21/2019  . Depression, recurrent (HCC) 09/21/2019  . Trouble in sleeping 09/21/2019   Resolved Ambulatory Problems    Diagnosis Date Noted  . Esophagitis 07/05/2015  . Obesity 08/16/2015  . Encounter for medication management in attention deficit hyperactivity disorder (ADHD) 03/04/2017   Past Medical History:  Diagnosis Date  . ADHD   . Chronic gastritis   . Depression   . Duodenitis  determined by biopsy   . Inflammatory bowel disease   . Obesity, pediatric   . Snoring    Reviewed med allergy problem list.     Observations/Objective: No acute distress Normal mood and appearance Normal breathing  .14/06/2016 Today's Vitals   02/08/20 0855  Weight: 132 lb (59.9 kg)  Height: 5\' 4"  (1.626 m)   Body mass index is 22.66 kg/m.    Assessment and Plan: 13/09/21 Devyne was seen today for adhd.  Diagnoses and all orders for this visit:  Attention deficit hyperactivity disorder (ADHD), predominantly inattentive type -     lisdexamfetamine (VYVANSE) 40 MG capsule; Take 1 capsule (40 mg total) by mouth every morning. -     lisdexamfetamine (VYVANSE) 40 MG capsule; Take 1 capsule (40 mg total) by mouth every morning. -     lisdexamfetamine (VYVANSE) 40 MG capsule; Take 1 capsule (40 mg total) by mouth every morning.   Ok to go 6 months.  Pt controlled and doing well. 3 months sent. Will call back in 3 months.   Follow Up Instructions:    I discussed the assessment and treatment plan with the patient. The patient was provided an opportunity to ask questions and all were answered. The patient agreed with the plan and demonstrated an understanding of the instructions.   The patient was advised to call back or seek an in-person evaluation if the symptoms worsen or if the condition fails to improve as anticipated.   Sherry Kitchen, PA-C

## 2020-02-08 NOTE — Progress Notes (Signed)
Needs refills on Vyvanse No other issues

## 2020-02-09 IMAGING — US US AXILLARY LEFT
1 series · 11 of 11 positions shown · non-contrast
Comparison: None.

CLINICAL DATA: 18-year-old presenting intermittent BILATERAL
axillary tenderness and possible palpable thickening

EXAM:
ULTRASOUND OF THE BILATERAL AXILLA

[Series 1: us axillary left · 0.06mm/px · 11 of 11 slices shown]
[im 1/11]
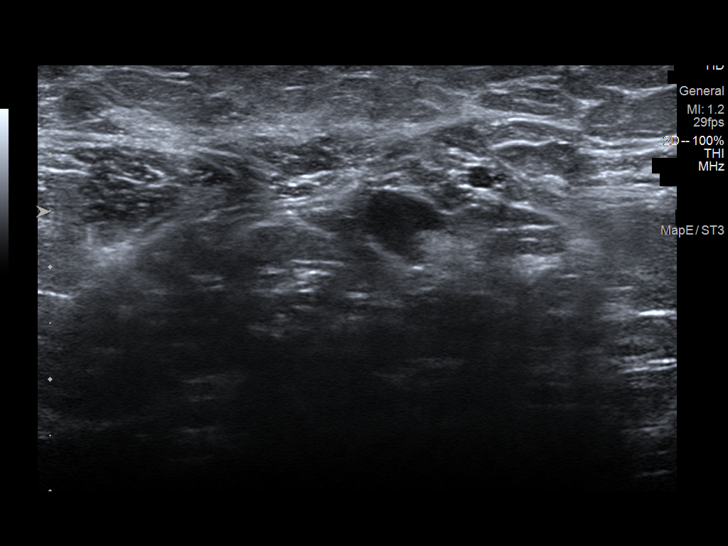
[im 2/11]
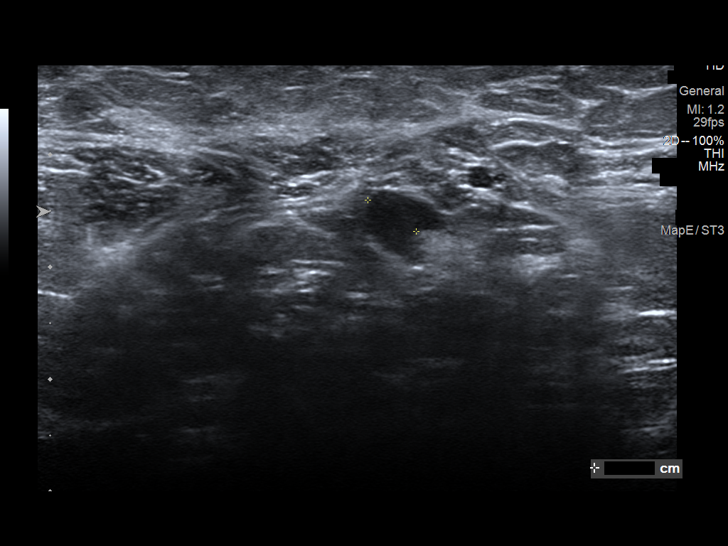
[im 3/11]
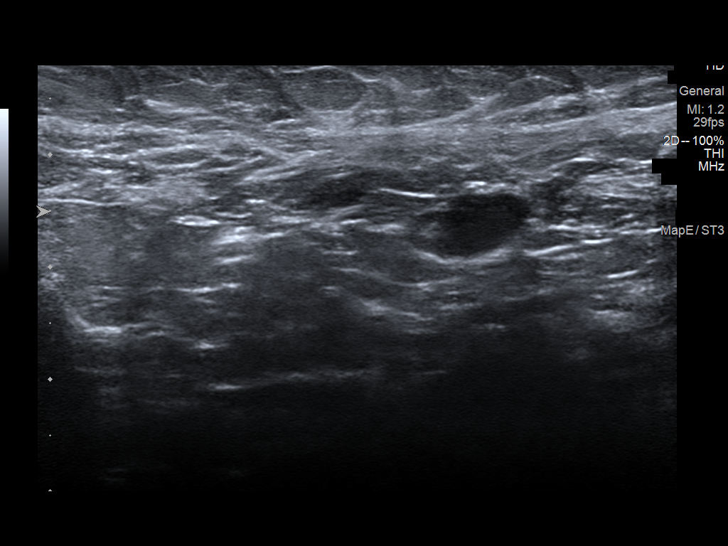
[im 4/11]
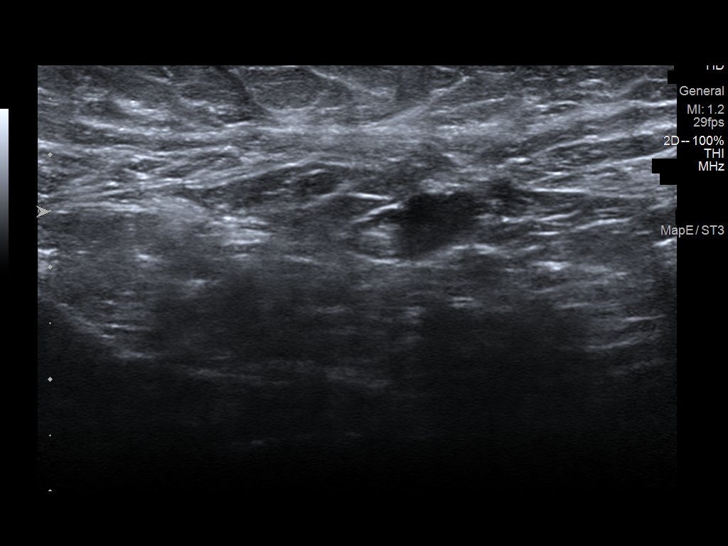
[im 5/11]
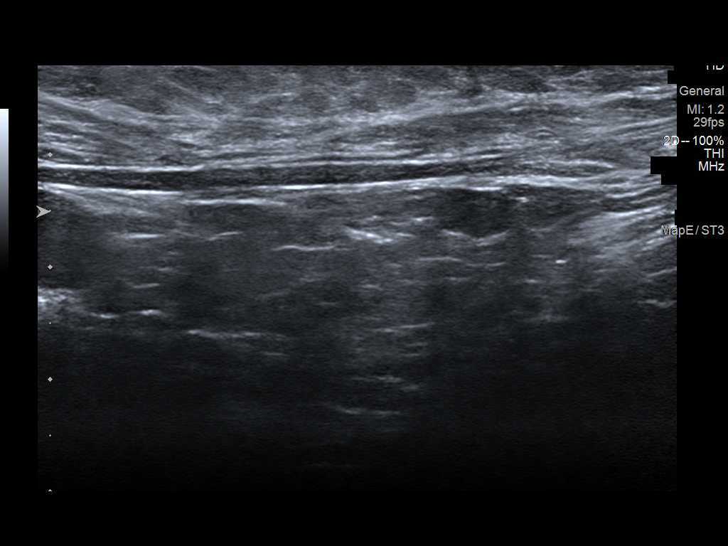
[im 6/11]
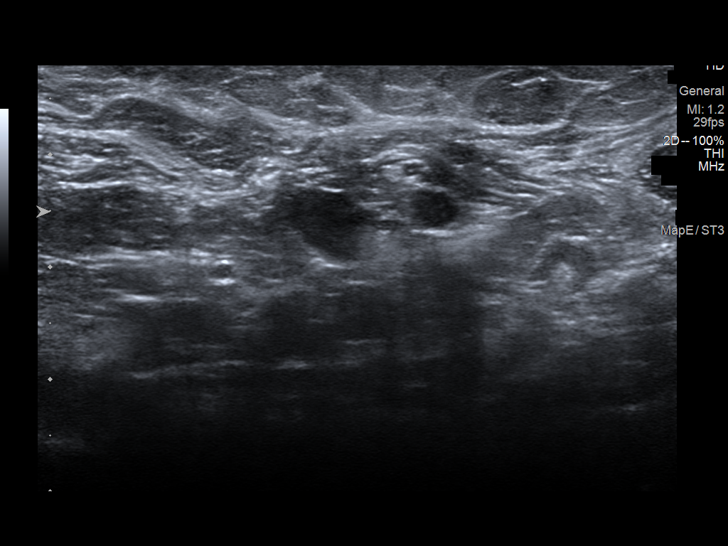
[im 7/11]
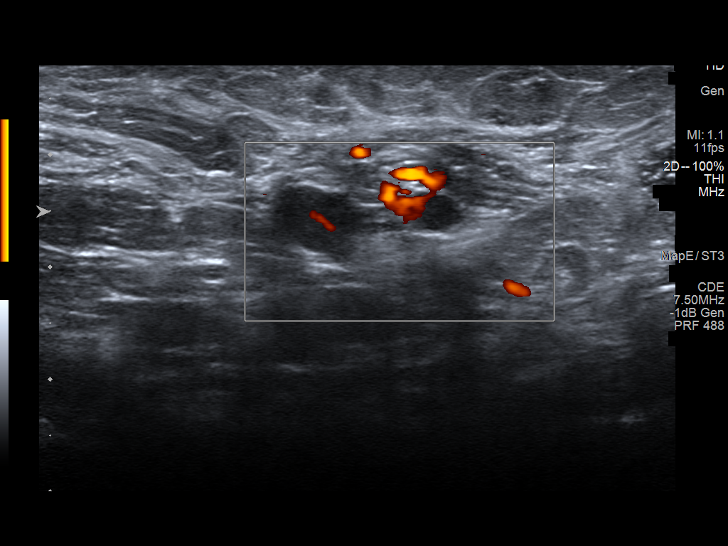
[im 8/11]
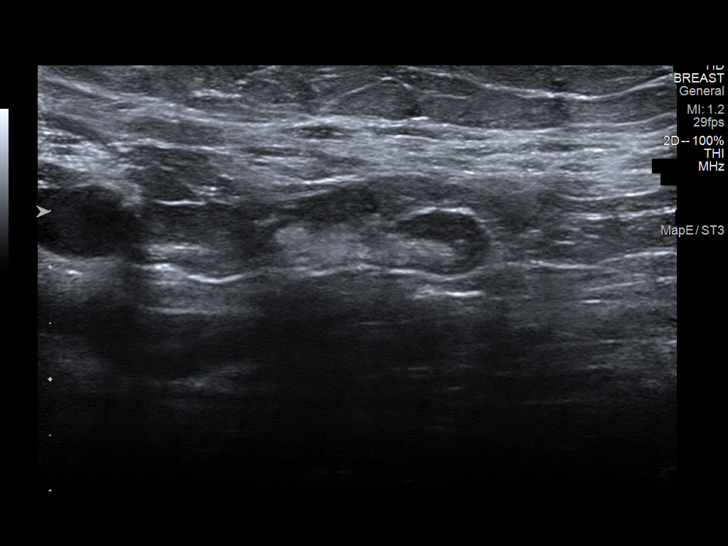
[im 9/11]
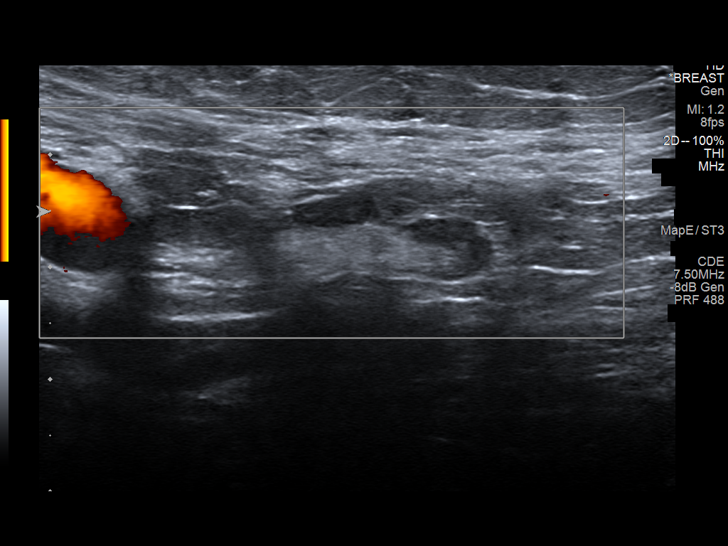
[im 10/11]
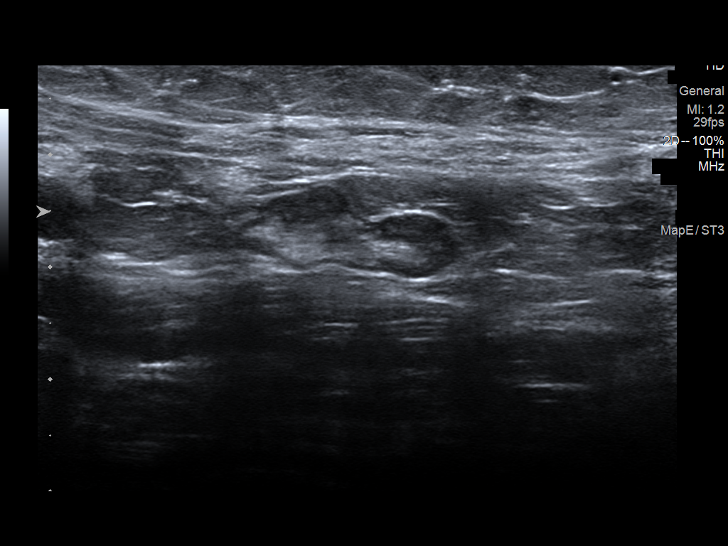
[im 11/11]
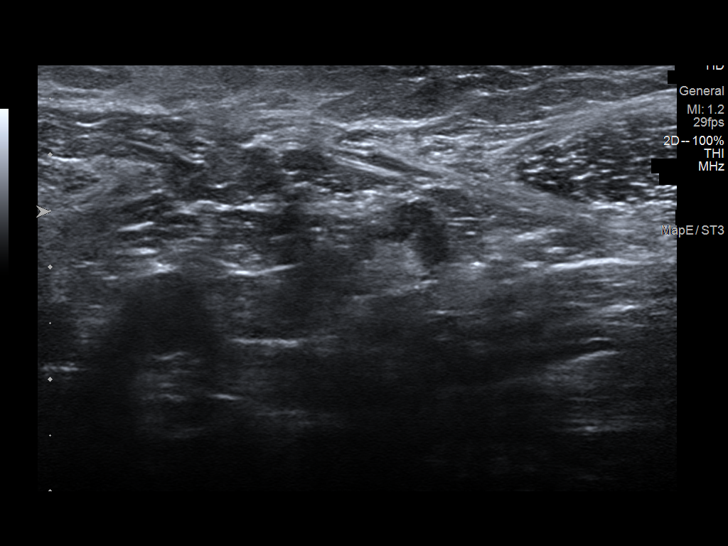

[11 of 11 positions shown; findings below may reference images not displayed]

FINDINGS: On correlative physical exam, there is no palpable abnormality in
either axilla. The patient describes tenderness to palpation,
particularly in the RIGHT axilla.

RIGHT axillary ultrasound is performed, showing a solitary lymph
node with with normal morphology demonstrating mild focal cortical
thickening up to approximately 4-5 mm. There is hyperemia in the
thickened cortex on power Doppler evaluation.

LEFT axillary ultrasound is performed, showing a solitary lymph node
with normal morphology demonstrating mild focal cortical thickening
up to approximately 4-5 mm. There is minimal hyperemia in the
thickened cortex on power Doppler evaluation.

There are no lymph nodes of abnormal morphology in either axilla.
IMPRESSION: 1. Solitary likely benign reactive mildly inflamed lymph node in the
RIGHT axilla with focal cortical thickening.
2. Solitary likely benign reactive mildly inflamed lymph node in the
LEFT axilla with focal cortical thickening.

RECOMMENDATION:
BILATERAL axillary ultrasound in 3 months.

I have discussed the findings and recommendations with the patient.
If applicable, a reminder letter will be sent to the patient
regarding the next appointment.

BI-RADS CATEGORY  3: Probably benign.

## 2020-03-10 ENCOUNTER — Other Ambulatory Visit: Payer: Self-pay | Admitting: Physician Assistant

## 2020-03-10 DIAGNOSIS — L700 Acne vulgaris: Secondary | ICD-10-CM

## 2020-03-10 DIAGNOSIS — Z3009 Encounter for other general counseling and advice on contraception: Secondary | ICD-10-CM

## 2020-04-14 ENCOUNTER — Encounter: Payer: Self-pay | Admitting: Physician Assistant

## 2020-04-14 ENCOUNTER — Telehealth (INDEPENDENT_AMBULATORY_CARE_PROVIDER_SITE_OTHER): Payer: Managed Care, Other (non HMO) | Admitting: Physician Assistant

## 2020-04-14 DIAGNOSIS — F900 Attention-deficit hyperactivity disorder, predominantly inattentive type: Secondary | ICD-10-CM | POA: Diagnosis not present

## 2020-04-14 DIAGNOSIS — F419 Anxiety disorder, unspecified: Secondary | ICD-10-CM | POA: Diagnosis not present

## 2020-04-14 MED ORDER — LISDEXAMFETAMINE DIMESYLATE 40 MG PO CAPS: 40.0000 mg | ORAL_CAPSULE | ORAL | 0 refills | Status: DC

## 2020-04-14 MED ORDER — PROPRANOLOL HCL 10 MG PO TABS: ORAL_TABLET | ORAL | 1 refills | Status: DC

## 2020-04-14 NOTE — Progress Notes (Signed)
LVM pt did not answer.

## 2020-04-14 NOTE — Progress Notes (Signed)
Pt.stated having anxiety from school work test taking and need refill of her Vyvanse.

## 2020-04-14 NOTE — Progress Notes (Signed)
Patient ID: Sherry Yang, female   DOB: 06-25-2000, 20 y.o.   MRN: [id number] .Marland KitchenVirtual Visit via Telephone Note  I connected with [name] on 04/14/20 at  3:00 PM EST by telephone and verified that I am speaking with the correct person using two identifiers.  Location: Patient: home Provider: clinic  .Marland KitchenParticipating in visit:  Patient: Sherry Yang Provider: Tandy Gaw Provider in training: Abbott Pao   I discussed the limitations, risks, security and privacy concerns of performing an evaluation and management service by telephone and the availability of in person appointments. I also discussed with the patient that there may be a patient responsible charge related to this service. The patient expressed understanding and agreed to proceed.   History of Present Illness: Patient is a 20 year old female with ADHD and anxiety who calls into the clinic for medication refills.  Patient is doing well on Vyvanse.  She does need refill.  She denies any palpitations, headaches, vision changes.  She does have some ongoing anxiety but it is not worsened by Vyvanse.  Her anxiety seems to be more around her schooling and test taking.  She has other times where she knows she can be highly anxious.  She would like to try propranolol as needed for this.  She has heard from others that it has been really effective.  She did try the BuSpar but it did not seem to help very much and she felt like she had to take it daily.  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  .. Active Ambulatory Problems    Diagnosis Date Noted  . Gastritis without bleeding 08/16/2015  . Duodenitis 07/05/2015  . IBD (inflammatory bowel disease) 03/30/2014  . Irregular menses 11/26/2014  . Moderate episode of recurrent major depressive disorder (HCC) 11/26/2014  . Attention deficit hyperactivity disorder (ADHD), predominantly inattentive type 11/20/2016  . Chronic midline thoracic back pain 11/20/2016  . History of  childhood obesity 11/20/2016  . History of syncope 03/07/2017  . Postural dizziness with near syncope 03/07/2017  . Acne vulgaris 03/07/2017  . Need for HPV vaccination 06/25/2017  . Axillary adenopathy 11/30/2018  . Anxiety 02/03/2019  . GAD (generalized anxiety disorder) 09/21/2019  . Depression, recurrent (HCC) 09/21/2019  . Trouble in sleeping 09/21/2019   Resolved Ambulatory Problems    Diagnosis Date Noted  . Esophagitis 07/05/2015  . Obesity 08/16/2015  . Encounter for medication management in attention deficit hyperactivity disorder (ADHD) 03/04/2017   Past Medical History:  Diagnosis Date  . ADHD   . Chronic gastritis   . Depression   . Duodenitis determined by biopsy   . Inflammatory bowel disease   . Obesity, pediatric   . Snoring    Reviewed med, allergy, problem list.     Observations/Objective:  No acute distress Normal mood No problems breathing.    Assessment and Plan: Marland KitchenMarland KitchenDiagnoses and all orders for this visit:  Attention deficit hyperactivity disorder (ADHD), predominantly inattentive type -     lisdexamfetamine (VYVANSE) 40 MG capsule; Take 1 capsule (40 mg total) by mouth every morning. -     lisdexamfetamine (VYVANSE) 40 MG capsule; Take 1 capsule (40 mg total) by mouth every morning. -     lisdexamfetamine (VYVANSE) 40 MG capsule; Take 1 capsule (40 mg total) by mouth every morning.  Anxiety -     lisdexamfetamine (VYVANSE) 40 MG capsule; Take 1 capsule (40 mg total) by mouth every morning. -     propranolol (INDERAL) 10 MG  tablet; Take one to two tablets about 1 hour before test or anxiety driven activity.   Refilled vyvanse for 3 months.   Added propranolol for test anxiety.   Follow up 3 months.   Follow Up Instructions:    I discussed the assessment and treatment plan with the patient. The patient was provided an opportunity to ask questions and all were answered. The patient agreed with the plan and demonstrated an understanding of  the instructions.   The patient was advised to call back or seek an in-person evaluation if the symptoms worsen or if the condition fails to improve as anticipated.  I provided 10 minutes of non-face-to-face time during this encounter.   Tandy Gaw, PA-C

## 2020-05-06 ENCOUNTER — Other Ambulatory Visit: Payer: Self-pay | Admitting: Physician Assistant

## 2020-05-06 DIAGNOSIS — F419 Anxiety disorder, unspecified: Secondary | ICD-10-CM

## 2020-05-21 ENCOUNTER — Other Ambulatory Visit: Payer: Self-pay | Admitting: Physician Assistant

## 2020-05-21 DIAGNOSIS — L700 Acne vulgaris: Secondary | ICD-10-CM

## 2020-05-21 DIAGNOSIS — Z3009 Encounter for other general counseling and advice on contraception: Secondary | ICD-10-CM

## 2020-05-21 DIAGNOSIS — F419 Anxiety disorder, unspecified: Secondary | ICD-10-CM

## 2020-06-07 ENCOUNTER — Other Ambulatory Visit: Payer: Self-pay | Admitting: Physician Assistant

## 2020-06-07 DIAGNOSIS — F419 Anxiety disorder, unspecified: Secondary | ICD-10-CM

## 2020-06-27 ENCOUNTER — Telehealth (INDEPENDENT_AMBULATORY_CARE_PROVIDER_SITE_OTHER): Payer: Managed Care, Other (non HMO) | Admitting: Physician Assistant

## 2020-06-27 ENCOUNTER — Encounter: Payer: Self-pay | Admitting: Physician Assistant

## 2020-06-27 VITALS — Ht 64.0 in | Wt 129.0 lb

## 2020-06-27 DIAGNOSIS — F419 Anxiety disorder, unspecified: Secondary | ICD-10-CM

## 2020-06-27 DIAGNOSIS — G479 Sleep disorder, unspecified: Secondary | ICD-10-CM

## 2020-06-27 DIAGNOSIS — F411 Generalized anxiety disorder: Secondary | ICD-10-CM | POA: Diagnosis not present

## 2020-06-27 DIAGNOSIS — F331 Major depressive disorder, recurrent, moderate: Secondary | ICD-10-CM | POA: Diagnosis not present

## 2020-06-27 DIAGNOSIS — F900 Attention-deficit hyperactivity disorder, predominantly inattentive type: Secondary | ICD-10-CM

## 2020-06-27 MED ORDER — PROPRANOLOL HCL 10 MG PO TABS: ORAL_TABLET | ORAL | 1 refills | Status: DC

## 2020-06-27 MED ORDER — HYDROXYZINE HCL 25 MG PO TABS
25.0000 mg | ORAL_TABLET | Freq: Three times a day (TID) | ORAL | 1 refills | Status: DC | PRN
Start: 2020-06-27 — End: 2021-06-19

## 2020-06-27 MED ORDER — LISDEXAMFETAMINE DIMESYLATE 40 MG PO CAPS: 40.0000 mg | ORAL_CAPSULE | ORAL | 0 refills | Status: DC

## 2020-06-27 NOTE — Progress Notes (Signed)
Wants to discuss anxiety medication, buspar doesn't work, has tried twice.   Needs refills of Vyvanse.   PHQ9 (14) -GAD7 (16) completed.

## 2020-06-27 NOTE — Progress Notes (Signed)
Patient ID: Sherry Yang, female   DOB: [birth year], 20 y.o.   MRN: [id number] .Marland KitchenVirtual Visit via Video Note  I connected with Sherry Yang on 06/27/2020 at  3:00 PM EDT by a video enabled telemedicine application and verified that I am speaking with the correct person using two identifiers.  Location: Patient: home Provider: clinic  .Marland KitchenParticipating in visit:  Patient: Sherry Yang Provider: Tandy Gaw PA-C   I discussed the limitations of evaluation and management by telemedicine and the availability of in person appointments. The patient expressed understanding and agreed to proceed.  History of Present Illness: [name], [name], and anxiety who calls into the clinic for medication refills.   She is not doing well. Her anxiety is not controlled. She does not feel like buspar helps anxiety in the moment but does not want to take daily. Her boyfriend moved away and school is very stressful. She feels like her anxiety is making her focus worse. No SI/HC. She is in counseling. Still wants something as needed for anxiety not daily. She is having trouble sleeping as well. Not trying anything.    .. Active Ambulatory Problems    Diagnosis Date Noted  . Gastritis without bleeding 08/16/2015  . Duodenitis 07/05/2015  . IBD (inflammatory bowel disease) 03/30/2014  . Irregular menses 11/26/2014  . Moderate episode of recurrent major depressive disorder (HCC) 11/26/2014  . Attention deficit hyperactivity disorder (ADHD), predominantly inattentive type 11/20/2016  . Chronic midline thoracic back pain 11/20/2016  . History of childhood obesity 11/20/2016  . History of syncope 03/07/2017  . Postural dizziness with near syncope 03/07/2017  . Acne vulgaris 03/07/2017  . Need for HPV vaccination 06/25/2017  . Axillary adenopathy 11/30/2018  . Anxiety 02/03/2019  . GAD (generalized anxiety disorder) 09/21/2019  . Depression, recurrent (HCC) 09/21/2019  . Trouble in sleeping  09/21/2019   Resolved Ambulatory Problems    Diagnosis Date Noted  . Esophagitis 07/05/2015  . Obesity 08/16/2015  . Encounter for medication management in attention deficit hyperactivity disorder (ADHD) 03/04/2017   Past Medical History:  Diagnosis Date  . ADHD   . Chronic gastritis   . Depression   . Duodenitis determined by biopsy   . Inflammatory bowel disease   . Obesity, pediatric   . Snoring    Reviewed med, allergy, problem list.     Observations/Objective: No acute distress Normal mood and appearance.   .. Today's Vitals   06/27/20 1335  Weight: 129 lb (58.5 kg)  Height: 5\' 4"  (1.626 m)   Body mass index is 22.14 kg/m.  .. Depression screen [facility] 2/9 06/27/2020 02/08/2020 09/21/2019 07/05/2019 03/17/2019  Decreased Interest 3 0 2 0 0  Down, Depressed, Hopeless 0 0 2 0 0  PHQ - 2 Score 3 0 4 0 0  Altered sleeping 1 - 3 1 -  Tired, decreased energy 3 - 3 1 -  Change in appetite 1 - 3 0 -  Feeling bad or failure about yourself  0 - 0 0 -  Trouble concentrating 3 - 2 3 -  Moving slowly or fidgety/restless 3 - 0 1 -  Suicidal thoughts 0 - 0 0 -  PHQ-9 Score 14 - 15 6 -  Difficult doing work/chores Very difficult - Very difficult Somewhat difficult -   .. GAD 7 : Generalized Anxiety Score 06/27/2020 09/21/2019 07/05/2019 03/17/2019  Nervous, Anxious, on Edge 3 3 0 1  Control/stop worrying 3 3 0 0  Worry  too much - different things 3 3 0 1  Trouble relaxing 3 3 0 1  Restless 1 3 0 1  Easily annoyed or irritable 3 3 0 0  Afraid - awful might happen 0 1 0 0  Total GAD 7 Score 16 19 0 4  Anxiety Difficulty Somewhat difficult Very difficult Not difficult at all Not difficult at all     Assessment and Plan: Marland KitchenMarland KitchenOluwanifemi was seen today for anxiety and adhd.  Diagnoses and all orders for this visit:  Attention deficit hyperactivity disorder (ADHD), predominantly inattentive type -     lisdexamfetamine (VYVANSE) 40 MG capsule; Take 1 capsule (40 mg total) by mouth  every morning. -     lisdexamfetamine (VYVANSE) 40 MG capsule; Take 1 capsule (40 mg total) by mouth every morning. -     lisdexamfetamine (VYVANSE) 40 MG capsule; Take 1 capsule (40 mg total) by mouth every morning.  Anxiety -     propranolol (INDERAL) 10 MG tablet; TAKE ONE TO TWO TABLETS BY MOUTH 1 HOURS BEFORE TEST OR ANXIETY DRIVEN ACTIVITY -     hydrOXYzine (ATARAX/VISTARIL) 25 MG tablet; Take 1 tablet (25 mg total) by mouth 3 (three) times daily as needed.  GAD (generalized anxiety disorder)  Moderate episode of recurrent major depressive disorder (HCC)  Trouble in sleeping   PHQ and GAD numbers not to goal.  Continue vyvanse for now.  Added as needed vistaril for anxiety.  Discussed trouble sleeping and to try unisom OTC and/or vistaril.  For GAD need for SSRI. Pt declines treatment with that today.  Follow up in 3 months.     Follow Up Instructions:    I discussed the assessment and treatment plan with the patient. The patient was provided an opportunity to ask questions and all were answered. The patient agreed with the plan and demonstrated an understanding of the instructions.   The patient was advised to call back or seek an in-person evaluation if the symptoms worsen or if the condition fails to improve as anticipated.   Tandy Gaw, PA-C

## 2020-06-28 ENCOUNTER — Encounter: Payer: Self-pay | Admitting: Physician Assistant

## 2020-07-04 ENCOUNTER — Other Ambulatory Visit: Payer: Self-pay | Admitting: Physician Assistant

## 2020-07-04 DIAGNOSIS — F419 Anxiety disorder, unspecified: Secondary | ICD-10-CM

## 2020-08-01 ENCOUNTER — Other Ambulatory Visit: Payer: Self-pay | Admitting: Physician Assistant

## 2020-08-01 DIAGNOSIS — F419 Anxiety disorder, unspecified: Secondary | ICD-10-CM

## 2020-08-09 ENCOUNTER — Telehealth (INDEPENDENT_AMBULATORY_CARE_PROVIDER_SITE_OTHER): Payer: Managed Care, Other (non HMO) | Admitting: Physician Assistant

## 2020-08-09 ENCOUNTER — Encounter: Payer: Self-pay | Admitting: Physician Assistant

## 2020-08-09 VITALS — BP 110/78 | Ht 64.0 in | Wt 131.0 lb

## 2020-08-09 DIAGNOSIS — F900 Attention-deficit hyperactivity disorder, predominantly inattentive type: Secondary | ICD-10-CM

## 2020-08-09 DIAGNOSIS — Z3009 Encounter for other general counseling and advice on contraception: Secondary | ICD-10-CM | POA: Diagnosis not present

## 2020-08-09 DIAGNOSIS — J358 Other chronic diseases of tonsils and adenoids: Secondary | ICD-10-CM | POA: Diagnosis not present

## 2020-08-09 DIAGNOSIS — L700 Acne vulgaris: Secondary | ICD-10-CM | POA: Diagnosis not present

## 2020-08-09 DIAGNOSIS — R2242 Localized swelling, mass and lump, left lower limb: Secondary | ICD-10-CM | POA: Insufficient documentation

## 2020-08-09 MED ORDER — LISDEXAMFETAMINE DIMESYLATE 40 MG PO CAPS: 40.0000 mg | ORAL_CAPSULE | ORAL | 0 refills | Status: DC

## 2020-08-09 MED ORDER — LISDEXAMFETAMINE DIMESYLATE 40 MG PO CAPS
40.0000 mg | ORAL_CAPSULE | ORAL | 0 refills | Status: DC
Start: 2020-09-08 — End: 2020-11-15

## 2020-08-09 MED ORDER — NORGESTIM-ETH ESTRAD TRIPHASIC 0.18/0.215/0.25 MG-25 MCG PO TABS: 1.0000 | ORAL_TABLET | Freq: Every day | ORAL | 3 refills | Status: DC

## 2020-08-09 NOTE — Progress Notes (Signed)
Having tonsil stones, wanted to discuss  Two cysts on front of left shin  Needs refills of Vyvanse, was at school, back here so May refill was not picked up. Pended

## 2020-08-09 NOTE — Progress Notes (Signed)
Patient ID: Sherry Yang, female   DOB: November 20, 2000, 20 y.o.   MRN: [id number] .Marland KitchenVirtual Visit via Telephone Note  I connected with Cacie Gaskins Luebbe on 08/09/20 at 10:30 AM EDT by telephone and verified that I am speaking with the correct person using two identifiers.  Location: Patient: home Provider: clinic  .Marland KitchenParticipating in visit:  Patient: Kylinn Provider: Tandy Gaw PA-C   I discussed the limitations, risks, security and privacy concerns of performing an evaluation and management service by telephone and the availability of in person appointments. I also discussed with the patient that there may be a patient responsible charge related to this service. The patient expressed understanding and agreed to proceed.   History of Present Illness: Patient is a 20 year old female with ADHD, anxiety who presents to the clinic for medication refills.  Patient reports no problems with her Vyvanse.  She is doing very well.  She did not pick up the last dose of Vyvanse and Sophia.  She does need her 3 refills while she is home for the summer.  She denies any increase in anxiety or insomnia.  She is having new problem of tonsil stones.  She is more worried that they are causing bad breath.  She has never had this problem before.  She denies any pain but there is some irritation.  She wonders what she can do about this.  She also has 2 nodules of her left lower anterior leg.  They are not tender to touch.  They are kind of soft.  They have popped up over the past few weeks.  She does have some intermittent varicose veins of the lower leg.  .. Active Ambulatory Problems    Diagnosis Date Noted  . Gastritis without bleeding 08/16/2015  . Duodenitis 07/05/2015  . IBD (inflammatory bowel disease) 03/30/2014  . Irregular menses 11/26/2014  . Moderate episode of recurrent major depressive disorder (HCC) 11/26/2014  . Attention deficit hyperactivity disorder (ADHD), predominantly inattentive type  11/20/2016  . Chronic midline thoracic back pain 11/20/2016  . History of childhood obesity 11/20/2016  . History of syncope 03/07/2017  . Postural dizziness with near syncope 03/07/2017  . Acne vulgaris 03/07/2017  . Need for HPV vaccination 06/25/2017  . Axillary adenopathy 11/30/2018  . Anxiety 02/03/2019  . GAD (generalized anxiety disorder) 09/21/2019  . Depression, recurrent (HCC) 09/21/2019  . Trouble in sleeping 09/21/2019  . Tonsillith 08/09/2020  . Birth control counseling 08/09/2020  . Nodule of skin of left lower leg 08/09/2020   Resolved Ambulatory Problems    Diagnosis Date Noted  . Esophagitis 07/05/2015  . Obesity 08/16/2015  . Encounter for medication management in attention deficit hyperactivity disorder (ADHD) 03/04/2017   Past Medical History:  Diagnosis Date  . ADHD   . Chronic gastritis   . Depression   . Duodenitis determined by biopsy   . Inflammatory bowel disease   . Obesity, pediatric   . Snoring     Observations/Objective: No acute distress Normal mood    Assessment and Plan: Marland KitchenMarland KitchenChereese was seen today for adhd.  Diagnoses and all orders for this visit:  Attention deficit hyperactivity disorder (ADHD), predominantly inattentive type -     lisdexamfetamine (VYVANSE) 40 MG capsule; Take 1 capsule (40 mg total) by mouth every morning. -     lisdexamfetamine (VYVANSE) 40 MG capsule; Take 1 capsule (40 mg total) by mouth every morning. -     lisdexamfetamine (VYVANSE) 40 MG capsule; Take 1 capsule (40 mg total) by  mouth every morning.  Birth control counseling -     Norgestimate-Ethinyl Estradiol Triphasic (TRI-LO-MARZIA) 0.18/0.215/0.25 MG-25 MCG tab; Take 1 tablet by mouth daily.  Acne vulgaris -     Norgestimate-Ethinyl Estradiol Triphasic (TRI-LO-MARZIA) 0.18/0.215/0.25 MG-25 MCG tab; Take 1 tablet by mouth daily.  Tonsillith  Nodule of skin of left lower leg   Refills for 3 months sent for Vyvanse.  Pap needed at 20.  Refilled  birth control.  Declines STD testing for now.  Discussed tonsil stones.  Discussed how to prevent and treat.  Reassured patient they are not dangerous.  If they are still becoming problematic could consider ENT referral for removal of tonsils.  Patient reports some nodules of her lower leg.  Encourage patient to come in so I can physically look at them.   Follow Up Instructions:    I discussed the assessment and treatment plan with the patient. The patient was provided an opportunity to ask questions and all were answered. The patient agreed with the plan and demonstrated an understanding of the instructions.   The patient was advised to call back or seek an in-person evaluation if the symptoms worsen or if the condition fails to improve as anticipated.  I provided 20 minutes of non-face-to-face time during this encounter.   Tandy Gaw, PA-C

## 2020-08-23 ENCOUNTER — Other Ambulatory Visit: Payer: Self-pay

## 2020-08-23 ENCOUNTER — Ambulatory Visit (INDEPENDENT_AMBULATORY_CARE_PROVIDER_SITE_OTHER): Payer: Managed Care, Other (non HMO) | Admitting: Physician Assistant

## 2020-08-23 VITALS — BP 130/79 | HR 110 | Temp 98.5°F | Ht 64.0 in | Wt 132.0 lb

## 2020-08-23 DIAGNOSIS — R300 Dysuria: Secondary | ICD-10-CM | POA: Diagnosis not present

## 2020-08-23 DIAGNOSIS — M545 Low back pain, unspecified: Secondary | ICD-10-CM

## 2020-08-23 DIAGNOSIS — F411 Generalized anxiety disorder: Secondary | ICD-10-CM | POA: Diagnosis not present

## 2020-08-23 LAB — POCT URINALYSIS DIP (CLINITEK)
Bilirubin, UA: NEGATIVE
Blood, UA: NEGATIVE
Glucose, UA: NEGATIVE mg/dL
Ketones, POC UA: NEGATIVE mg/dL
Leukocytes, UA: NEGATIVE
Nitrite, UA: NEGATIVE
POC PROTEIN,UA: NEGATIVE
Spec Grav, UA: 1.025 (ref 1.010–1.025)
Urobilinogen, UA: 0.2 E.U./dL
pH, UA: 7 (ref 5.0–8.0)

## 2020-08-23 MED ORDER — BUSPIRONE HCL 7.5 MG PO TABS: 7.5000 mg | ORAL_TABLET | Freq: Two times a day (BID) | ORAL | 1 refills | Status: DC

## 2020-08-23 MED ORDER — CITALOPRAM HYDROBROMIDE 10 MG PO TABS: 10.0000 mg | ORAL_TABLET | Freq: Every day | ORAL | 2 refills | Status: DC

## 2020-08-23 NOTE — Progress Notes (Signed)
Subjective:    Patient ID: Sherry Yang, female    DOB: 2000-11-30, 20 y.o.   MRN: [id number]  HPI  Pt is a 20 yo female who presents to the clinic with right mid back pain for 4 days. She had pain similar at one point and was UTI. She denies any fever, chills, nausea, vomiting, headache, abdominal pain. She does have some discomfort with urination at times. Pain is worse with movement but better today. No vaginal itching, discharge or pain.   Her anxiety is not controlled on vistaril. She has problems most days with stress and anxiety. She did feel better on buspar. She thought it was not helping but realized it is helping.   .. Active Ambulatory Problems    Diagnosis Date Noted  . Gastritis without bleeding 08/16/2015  . Duodenitis 07/05/2015  . IBD (inflammatory bowel disease) 03/30/2014  . Irregular menses 11/26/2014  . Moderate episode of recurrent major depressive disorder (HCC) 11/26/2014  . Attention deficit hyperactivity disorder (ADHD), predominantly inattentive type 11/20/2016  . Chronic midline thoracic back pain 11/20/2016  . History of childhood obesity 11/20/2016  . History of syncope 03/07/2017  . Postural dizziness with near syncope 03/07/2017  . Acne vulgaris 03/07/2017  . Need for HPV vaccination 06/25/2017  . Axillary adenopathy 11/30/2018  . Anxiety 02/03/2019  . GAD (generalized anxiety disorder) 09/21/2019  . Depression, recurrent (HCC) 09/21/2019  . Trouble in sleeping 09/21/2019  . Tonsillith 08/09/2020  . Birth control counseling 08/09/2020  . Nodule of skin of left lower leg 08/09/2020   Resolved Ambulatory Problems    Diagnosis Date Noted  . Esophagitis 07/05/2015  . Obesity 08/16/2015  . Encounter for medication management in attention deficit hyperactivity disorder (ADHD) 03/04/2017   Past Medical History:  Diagnosis Date  . ADHD   . Chronic gastritis   . Depression   . Duodenitis determined by biopsy   . Inflammatory bowel disease   .  Obesity, pediatric   . Snoring       Review of Systems See HPI.     Objective:   Physical Exam Vitals reviewed.  Constitutional:      Appearance: Normal appearance.  HENT:     Head: Normocephalic.  Cardiovascular:     Rate and Rhythm: Normal rate and regular rhythm.     Pulses: Normal pulses.  Pulmonary:     Effort: Pulmonary effort is normal.     Breath sounds: Normal breath sounds.  Abdominal:     General: Bowel sounds are normal. There is no distension.     Palpations: Abdomen is soft.     Tenderness: There is no abdominal tenderness. There is no right CVA tenderness, left CVA tenderness, guarding or rebound.  Musculoskeletal:     Right lower leg: No edema.     Left lower leg: No edema.  Neurological:     General: No focal deficit present.     Mental Status: She is alert and oriented to person, place, and time.  Psychiatric:        Mood and Affect: Mood normal.          .. Results for orders placed or performed in visit on 08/23/20  Urine Culture   Specimen: Urine  Result Value Ref Range   MICRO NUMBER: [id number]    SPECIMEN QUALITY: Adequate    Sample Source URINE    STATUS: FINAL    Result: No Growth   POCT URINALYSIS DIP (CLINITEK)  Result Value Ref Range  Color, UA yellow yellow   Clarity, UA clear clear   Glucose, UA negative negative mg/dL   Bilirubin, UA negative negative   Ketones, POC UA negative negative mg/dL   Spec Grav, UA 0.109 3.235 - 1.025   Blood, UA negative negative   pH, UA 7.0 5.0 - 8.0   POC PROTEIN,UA negative negative, trace   Urobilinogen, UA 0.2 0.2 or 1.0 E.U./dL   Nitrite, UA Negative Negative   Leukocytes, UA Negative Negative  .Marland Kitchen GAD 7 : Generalized Anxiety Score 06/27/2020 09/21/2019 07/05/2019 03/17/2019  Nervous, Anxious, on Edge 3 3 0 1  Control/stop worrying 3 3 0 0  Worry too much - different things 3 3 0 1  Trouble relaxing 3 3 0 1  Restless 1 3 0 1  Easily annoyed or irritable 3 3 0 0  Afraid - awful might  happen 0 1 0 0  Total GAD 7 Score 16 19 0 4  Anxiety Difficulty Somewhat difficult Very difficult Not difficult at all Not difficult at all    .Marland Kitchen Depression screen [facility] 2/9 06/27/2020 02/08/2020 09/21/2019 07/05/2019 03/17/2019  Decreased Interest 3 0 2 0 0  Down, Depressed, Hopeless 0 0 2 0 0  PHQ - 2 Score 3 0 4 0 0  Altered sleeping 1 - 3 1 -  Tired, decreased energy 3 - 3 1 -  Change in appetite 1 - 3 0 -  Feeling bad or failure about yourself  0 - 0 0 -  Trouble concentrating 3 - 2 3 -  Moving slowly or fidgety/restless 3 - 0 1 -  Suicidal thoughts 0 - 0 0 -  PHQ-9 Score 14 - 15 6 -  Difficult doing work/chores Very difficult - Very difficult Somewhat difficult -     Assessment & Plan:  Marland KitchenMarland KitchenAubrynn was seen today for urinary tract infection.  Diagnoses and all orders for this visit:  Acute right-sided low back pain without sciatica -     POCT URINALYSIS DIP (CLINITEK) -     Urine Culture  Dysuria -     POCT URINALYSIS DIP (CLINITEK) -     Urine Culture  GAD (generalized anxiety disorder) -     busPIRone (BUSPAR) 7.5 MG tablet; Take 1 tablet (7.5 mg total) by mouth 2 (two) times daily. -     citalopram (CELEXA) 10 MG tablet; Take 1 tablet (10 mg total) by mouth daily.   UA shows no signs of bacteria infection.  Symptomatic care discussed.  Will culture to confirm.  Likely flank pain is more muscle spasm. Discussed stretches, heat/ice.   Discussed anxiety.  Added buspar and celexa. Discussed side effects. Failed zoloft in the past. Encouraged exercise and counseling. Continue to use vistaril as needed. Follow up in 2 months.

## 2020-08-25 LAB — URINE CULTURE
MICRO NUMBER:: 11936097
Result:: NO GROWTH
SPECIMEN QUALITY:: ADEQUATE

## 2020-08-25 NOTE — Progress Notes (Signed)
Journe,  Culture confirmed no bacteria in urine.

## 2020-08-28 ENCOUNTER — Encounter: Payer: Self-pay | Admitting: Physician Assistant

## 2020-09-09 ENCOUNTER — Other Ambulatory Visit: Payer: Self-pay | Admitting: Physician Assistant

## 2020-09-09 DIAGNOSIS — F419 Anxiety disorder, unspecified: Secondary | ICD-10-CM

## 2020-09-15 ENCOUNTER — Other Ambulatory Visit: Payer: Self-pay | Admitting: Physician Assistant

## 2020-09-15 DIAGNOSIS — F411 Generalized anxiety disorder: Secondary | ICD-10-CM

## 2020-10-07 ENCOUNTER — Other Ambulatory Visit: Payer: Self-pay | Admitting: Physician Assistant

## 2020-10-07 DIAGNOSIS — F419 Anxiety disorder, unspecified: Secondary | ICD-10-CM

## 2020-11-15 ENCOUNTER — Encounter: Payer: Self-pay | Admitting: Physician Assistant

## 2020-11-15 ENCOUNTER — Ambulatory Visit (INDEPENDENT_AMBULATORY_CARE_PROVIDER_SITE_OTHER): Payer: Managed Care, Other (non HMO) | Admitting: Physician Assistant

## 2020-11-15 ENCOUNTER — Other Ambulatory Visit: Payer: Self-pay

## 2020-11-15 VITALS — BP 111/79 | HR 91 | Ht 64.0 in | Wt 133.0 lb

## 2020-11-15 DIAGNOSIS — F900 Attention-deficit hyperactivity disorder, predominantly inattentive type: Secondary | ICD-10-CM

## 2020-11-15 DIAGNOSIS — F419 Anxiety disorder, unspecified: Secondary | ICD-10-CM

## 2020-11-15 DIAGNOSIS — N9089 Other specified noninflammatory disorders of vulva and perineum: Secondary | ICD-10-CM | POA: Diagnosis not present

## 2020-11-15 LAB — WET PREP FOR TRICH, YEAST, CLUE
MICRO NUMBER:: 12255038
Specimen Quality: ADEQUATE

## 2020-11-15 MED ORDER — HYDROCORTISONE VALERATE 0.2 % EX CREA: 1.0000 "application " | TOPICAL_CREAM | Freq: Two times a day (BID) | CUTANEOUS | 0 refills | Status: DC

## 2020-11-15 MED ORDER — LISDEXAMFETAMINE DIMESYLATE 40 MG PO CAPS: 40.0000 mg | ORAL_CAPSULE | ORAL | 0 refills | Status: DC

## 2020-11-15 NOTE — Progress Notes (Signed)
No trich, no yeast, no clue cells. Wet prep looks great.

## 2020-11-15 NOTE — Progress Notes (Signed)
Subjective:    Patient ID: Sherry Yang, female    DOB: Nov 24, 2000, 20 y.o.   MRN: [id number]  HPI [name], Anxiety who presents to the clinic for medication refills.   Doing great on vyvanse. Needs refills. No depression but some anxiety controlled with buspar. She stopped celexa because she does not want to take it daily. She has noticed anxiety a little worse but would like to treat with above regimen. Pt is sleeping well. Denies any headaches, dizziness, palpitations.   She continues to have some vulvar itching. She feels like from scented soaps and detergents. No ulcers today. Ruled out herpes with GYN. No discharge today. Does have intermittent odor and hx of BV.   .. Active Ambulatory Problems    Diagnosis Date Noted   Gastritis without bleeding 08/16/2015   Duodenitis 07/05/2015   IBD (inflammatory bowel disease) 03/30/2014   Irregular menses 11/26/2014   Moderate episode of recurrent major depressive disorder (HCC) 11/26/2014   Attention deficit hyperactivity disorder (ADHD), predominantly inattentive type 11/20/2016   Chronic midline thoracic back pain 11/20/2016   History of childhood obesity 11/20/2016   History of syncope 03/07/2017   Postural dizziness with near syncope 03/07/2017   Acne vulgaris 03/07/2017   Need for HPV vaccination 06/25/2017   Axillary adenopathy 11/30/2018   Anxiety 02/03/2019   GAD (generalized anxiety disorder) 09/21/2019   Depression, recurrent (HCC) 09/21/2019   Trouble in sleeping 09/21/2019   Tonsillith 08/09/2020   Birth control counseling 08/09/2020   Nodule of skin of left lower leg 08/09/2020   Vulvar irritation 11/15/2020   Resolved Ambulatory Problems    Diagnosis Date Noted   Esophagitis 07/05/2015   Obesity 08/16/2015   Encounter for medication management in attention deficit hyperactivity disorder (ADHD) 03/04/2017   Past Medical History:  Diagnosis Date   ADHD    Chronic gastritis    Depression     Duodenitis determined by biopsy    Inflammatory bowel disease    Obesity, pediatric    Snoring      Review of Systems See PHI.     Objective:   Physical Exam Vitals reviewed.  Constitutional:      Appearance: Normal appearance.  HENT:     Head: Normocephalic.  Cardiovascular:     Rate and Rhythm: Normal rate and regular rhythm.     Pulses: Normal pulses.  Pulmonary:     Effort: Pulmonary effort is normal.  Genitourinary:    Comments: Pt declined pelvic exam.  Neurological:     General: No focal deficit present.     Mental Status: She is alert and oriented to person, place, and time.  Psychiatric:        Mood and Affect: Mood normal.      .. Depression screen [facility] 2/9 11/15/2020 06/27/2020 02/08/2020 09/21/2019 07/05/2019  Decreased Interest 0 3 0 2 0  Down, Depressed, Hopeless 0 0 0 2 0  PHQ - 2 Score 0 3 0 4 0  Altered sleeping 0 1 - 3 1  Tired, decreased energy 0 3 - 3 1  Change in appetite 0 1 - 3 0  Feeling bad or failure about yourself  0 0 - 0 0  Trouble concentrating 0 3 - 2 3  Moving slowly or fidgety/restless 0 3 - 0 1  Suicidal thoughts 0 0 - 0 0  PHQ-9 Score 0 14 - 15 6  Difficult doing work/chores Not difficult at all Very  difficult - Very difficult Somewhat difficult   .Marland Kitchen GAD 7 : Generalized Anxiety Score 11/15/2020 06/27/2020 09/21/2019 07/05/2019  Nervous, Anxious, on Edge 2 3 3  0  Control/stop worrying 1 3 3  0  Worry too much - different things 2 3 3  0  Trouble relaxing 0 3 3 0  Restless 0 1 3 0  Easily annoyed or irritable 1 3 3  0  Afraid - awful might happen 0 0 1 0  Total GAD 7 Score 6 16 19  0  Anxiety Difficulty Not difficult at all Somewhat difficult Very difficult Not difficult at all        Assessment & Plan:   .Sherry Yang was seen today for adhd.  Diagnoses and all orders for this visit:  Anxiety  Attention deficit hyperactivity disorder (ADHD), predominantly inattentive type -     lisdexamfetamine (VYVANSE) 40 MG capsule; Take 1  capsule (40 mg total) by mouth every morning. -     lisdexamfetamine (VYVANSE) 40 MG capsule; Take 1 capsule (40 mg total) by mouth every morning. -     lisdexamfetamine (VYVANSE) 40 MG capsule; Take 1 capsule (40 mg total) by mouth every morning.  Vulvar irritation -     hydrocortisone valerate cream (WESTCORT) 0.2 %; Apply 1 application topically 2 (two) times daily. -     WET PREP FOR TRICH, YEAST, CLUE  Vyvanse refilled for 3 months.  See PHQ/GAD numbers.   Per patient vyvanse does not make anxiety worse.  Continue buspar.  Follow up in 3 months.   Pt has had  BV before and intermittent odor. Will get wet prep. Symptoms sound more like contact dermitis from soaps and detergent. Discussed prevention. Westcort as needed bid for no more than 2 weeks at a time. Cool compresses with itching.

## 2020-11-28 ENCOUNTER — Other Ambulatory Visit: Payer: Self-pay

## 2020-11-28 ENCOUNTER — Telehealth (INDEPENDENT_AMBULATORY_CARE_PROVIDER_SITE_OTHER): Payer: Managed Care, Other (non HMO) | Admitting: Physician Assistant

## 2020-11-28 ENCOUNTER — Encounter: Payer: Self-pay | Admitting: Physician Assistant

## 2020-11-28 VITALS — Temp 101.3°F | Ht 64.0 in | Wt 133.0 lb

## 2020-11-28 DIAGNOSIS — J029 Acute pharyngitis, unspecified: Secondary | ICD-10-CM

## 2020-11-28 DIAGNOSIS — R059 Cough, unspecified: Secondary | ICD-10-CM

## 2020-11-28 DIAGNOSIS — R5383 Other fatigue: Secondary | ICD-10-CM

## 2020-11-28 DIAGNOSIS — U071 COVID-19: Secondary | ICD-10-CM

## 2020-11-28 MED ORDER — BENZONATATE 200 MG PO CAPS: 200.0000 mg | ORAL_CAPSULE | Freq: Two times a day (BID) | ORAL | 0 refills | Status: DC | PRN

## 2020-11-28 MED ORDER — HYDROCODONE BIT-HOMATROP MBR 5-1.5 MG/5ML PO SOLN: 5.0000 mL | Freq: Three times a day (TID) | ORAL | 0 refills | Status: DC | PRN

## 2020-11-28 NOTE — Progress Notes (Signed)
..Virtual Visit via Video Note  I connected with Sherry Yang on 11/28/20 at  8:30 AM EDT by a video enabled telemedicine application and verified that I am speaking with the correct person using two identifiers.  Location: Patient: home Provider: clinic  .Marland KitchenParticipating in visit:  Patient: Sherry Yang Provider: Tandy Gaw PA-C   I discussed the limitations of evaluation and management by telemedicine and the availability of in person appointments. The patient expressed understanding and agreed to proceed.  History of Present Illness: Pt is a 20 yo female who calls into the clinic on Day 4 of covid. Home test positive yesterday. PCR pending. Pt had both vaccines no booster. Pt c/o ST, body aches, headaches, cough, fever, chills. Taking tylenol/ibuprofen with minimal benefit. Denies any leg swelling or pain, SOB.   Marland Kitchen. Active Ambulatory Problems    Diagnosis Date Noted   Gastritis without bleeding 08/16/2015   Duodenitis 07/05/2015   IBD (inflammatory bowel disease) 03/30/2014   Irregular menses 11/26/2014   Moderate episode of recurrent major depressive disorder (HCC) 11/26/2014   Attention deficit hyperactivity disorder (ADHD), predominantly inattentive type 11/20/2016   Chronic midline thoracic back pain 11/20/2016   History of childhood obesity 11/20/2016   History of syncope 03/07/2017   Postural dizziness with near syncope 03/07/2017   Acne vulgaris 03/07/2017   Need for HPV vaccination 06/25/2017   Axillary adenopathy 11/30/2018   Anxiety 02/03/2019   GAD (generalized anxiety disorder) 09/21/2019   Depression, recurrent (HCC) 09/21/2019   Trouble in sleeping 09/21/2019   Tonsillith 08/09/2020   Birth control counseling 08/09/2020   Nodule of skin of left lower leg 08/09/2020   Vulvar irritation 11/15/2020   Resolved Ambulatory Problems    Diagnosis Date Noted   Esophagitis 07/05/2015   Obesity 08/16/2015   Encounter for medication management in attention deficit  hyperactivity disorder (ADHD) 03/04/2017   Past Medical History:  Diagnosis Date   ADHD    Chronic gastritis    Depression    Duodenitis determined by biopsy    Inflammatory bowel disease    Obesity, pediatric    Snoring     Observations/Objective: Pale appearance No acute distress Normal breathing, not labored. Dry cough  .Marland Kitchen Today's Vitals   11/28/20 0815  Temp: (!) 101.3 F (38.5 C)  TempSrc: Oral  Weight: 133 lb (60.3 kg)  Height: 5\' 4"  (1.626 m)   Body mass index is 22.83 kg/m.    Assessment and Plan: Marland KitchenSagal was seen today for covid positive.  Diagnoses and all orders for this visit:  COVID-19 virus infection -     HYDROcodone bit-homatropine (HYCODAN) 5-1.5 MG/5ML syrup; Take 5 mLs by mouth every 8 (eight) hours as needed for cough. -     benzonatate (TESSALON) 200 MG capsule; Take 1 capsule (200 mg total) by mouth 2 (two) times daily as needed for cough.  Cough -     HYDROcodone bit-homatropine (HYCODAN) 5-1.5 MG/5ML syrup; Take 5 mLs by mouth every 8 (eight) hours as needed for cough. -     benzonatate (TESSALON) 200 MG capsule; Take 1 capsule (200 mg total) by mouth 2 (two) times daily as needed for cough.  Sore throat  No energy  Day 4 covid. Low risk of complications. Does not qualify for anti-viral.  Continue symptomatic care with:  Zinc/vitamin C/tylenol/ibuprofen/mucinex/rest/hydration. Added tessalon pearls and hycodan.  Discussed sedation warning.  Written out of in person classes for the rest of week.  Discussed red flag symptoms to follow up sooner.  Follow Up Instructions:    I discussed the assessment and treatment plan with the patient. The patient was provided an opportunity to ask questions and all were answered. The patient agreed with the plan and demonstrated an understanding of the instructions.   The patient was advised to call back or seek an in-person evaluation if the symptoms worsen or if the condition fails to improve  as anticipated.    Tandy Gaw, PA-C

## 2020-11-28 NOTE — Progress Notes (Signed)
Started Saturday (got flu vaccine on Friday and thought it was that) Covid + yesterday at home, PCR done - results not back  Sore throat  Body aches Headache Fever/chills  Taking tylenol/aleve/ibuprofen for symptoms

## 2020-12-08 ENCOUNTER — Other Ambulatory Visit: Payer: Self-pay | Admitting: Physician Assistant

## 2020-12-08 DIAGNOSIS — F419 Anxiety disorder, unspecified: Secondary | ICD-10-CM

## 2020-12-10 ENCOUNTER — Other Ambulatory Visit: Payer: Self-pay | Admitting: Physician Assistant

## 2020-12-10 DIAGNOSIS — F411 Generalized anxiety disorder: Secondary | ICD-10-CM

## 2020-12-22 ENCOUNTER — Other Ambulatory Visit: Payer: Self-pay | Admitting: Physician Assistant

## 2020-12-22 DIAGNOSIS — F419 Anxiety disorder, unspecified: Secondary | ICD-10-CM

## 2021-01-05 ENCOUNTER — Telehealth (INDEPENDENT_AMBULATORY_CARE_PROVIDER_SITE_OTHER): Payer: Managed Care, Other (non HMO) | Admitting: Physician Assistant

## 2021-01-05 ENCOUNTER — Encounter: Payer: Self-pay | Admitting: Physician Assistant

## 2021-01-05 DIAGNOSIS — F900 Attention-deficit hyperactivity disorder, predominantly inattentive type: Secondary | ICD-10-CM | POA: Diagnosis not present

## 2021-01-05 MED ORDER — LISDEXAMFETAMINE DIMESYLATE 40 MG PO CAPS: 40.0000 mg | ORAL_CAPSULE | ORAL | 0 refills | Status: DC

## 2021-01-05 NOTE — Progress Notes (Signed)
Vyvanse refill for September was sent to local pharmacy and patient back in [place], needs refill sent there (has one available mid October but going out of town before this and needs to fill it)

## 2021-01-05 NOTE — Progress Notes (Signed)
Patient ID: Rosana Fret, female   DOB: 02-Jan-2001, 20 y.o.   MRN: [id number] .Marland KitchenVirtual Visit via Video Note  I connected with Hebe Merriwether Consolo on 01/05/21 at  2:20 PM EDT by a video enabled telemedicine application and verified that I am speaking with the correct person using two identifiers.  Location: Patient: home Provider: clinic   .Marland KitchenParticipating in visit:  Patient: Linsey Provider: Tandy Gaw PA-C  I discussed the limitations of evaluation and management by telemedicine and the availability of in person appointments. The patient expressed understanding and agreed to proceed.  History of Present Illness: [name] who needs refills on vyvanse. She needs to pick up this months a few days early because she is leaving on a [place] trip. No problems or concerns.    .. Active Ambulatory Problems    Diagnosis Date Noted   Gastritis without bleeding 08/16/2015   Duodenitis 07/05/2015   IBD (inflammatory bowel disease) 03/30/2014   Irregular menses 11/26/2014   Moderate episode of recurrent major depressive disorder (HCC) 11/26/2014   Attention deficit hyperactivity disorder (ADHD), predominantly inattentive type 11/20/2016   Chronic midline thoracic back pain 11/20/2016   History of childhood obesity 11/20/2016   History of syncope 03/07/2017   Postural dizziness with near syncope 03/07/2017   Acne vulgaris 03/07/2017   Need for HPV vaccination 06/25/2017   Axillary adenopathy 11/30/2018   Anxiety 02/03/2019   GAD (generalized anxiety disorder) 09/21/2019   Depression, recurrent (HCC) 09/21/2019   Trouble in sleeping 09/21/2019   Tonsillith 08/09/2020   Birth control counseling 08/09/2020   Nodule of skin of left lower leg 08/09/2020   Vulvar irritation 11/15/2020   COVID-19 virus infection 11/28/2020   Resolved Ambulatory Problems    Diagnosis Date Noted   Esophagitis 07/05/2015   Obesity 08/16/2015   Encounter for medication management in  attention deficit hyperactivity disorder (ADHD) 03/04/2017   Past Medical History:  Diagnosis Date   ADHD    Chronic gastritis    Depression    Duodenitis determined by biopsy    Inflammatory bowel disease    Obesity, pediatric    Snoring     Observations/Objective: No acute distress Normal mood and appearance  .Marland Kitchen Today's Vitals   01/05/21 1351  Weight: 133 lb (60.3 kg)  Height: 5\' 4"  (1.626 m)   Body mass index is 22.83 kg/m.   Assessment and Plan: Marland KitchenEmmalyn was seen today for follow-up.  Diagnoses and all orders for this visit:  Attention deficit hyperactivity disorder (ADHD), predominantly inattentive type -     lisdexamfetamine (VYVANSE) 40 MG capsule; Take 1 capsule (40 mg total) by mouth every morning. -     lisdexamfetamine (VYVANSE) 40 MG capsule; Take 1 capsule (40 mg total) by mouth every morning. -     lisdexamfetamine (VYVANSE) 40 MG capsule; Take 1 capsule (40 mg total) by mouth every morning.  Ok to pick up vyvanse early. [place]aving for [place] trip.  Refilled for 3 months.    Follow Up Instructions:    I discussed the assessment and treatment plan with the patient. The patient was provided an opportunity to ask questions and all were answered. The patient agreed with the plan and demonstrated an understanding of the instructions.   The patient was advised to call back or seek an in-person evaluation if the symptoms worsen or if the condition fails to improve as anticipated.   Fritzi Mandes, PA-C

## 2021-01-31 ENCOUNTER — Telehealth (INDEPENDENT_AMBULATORY_CARE_PROVIDER_SITE_OTHER): Payer: Managed Care, Other (non HMO) | Admitting: Physician Assistant

## 2021-01-31 ENCOUNTER — Encounter: Payer: Self-pay | Admitting: Physician Assistant

## 2021-01-31 VITALS — Ht 64.0 in | Wt 130.0 lb

## 2021-01-31 DIAGNOSIS — G2581 Restless legs syndrome: Secondary | ICD-10-CM | POA: Diagnosis not present

## 2021-01-31 DIAGNOSIS — Z820 Family history of epilepsy and other diseases of the nervous system: Secondary | ICD-10-CM | POA: Insufficient documentation

## 2021-01-31 MED ORDER — ROPINIROLE HCL 0.25 MG PO TABS: ORAL_TABLET | ORAL | 2 refills | Status: DC

## 2021-01-31 NOTE — Patient Instructions (Signed)
Restless Legs Syndrome Restless legs syndrome is a condition that causes uncomfortable feelings or sensations in the legs, especially while sitting or lying down. The sensations usually cause an overwhelming urge to move the legs. The arms can alsosometimes be affected. The condition can range from mild to severe. The symptoms often interfere witha person's ability to sleep. What are the causes? The cause of this condition is not known. What increases the risk? The following factors may make you more likely to develop this condition: Being older than 50. Pregnancy. Being a woman. In general, the condition is more common in women than in men. A family history of the condition. Having iron deficiency. Overuse of caffeine, nicotine, or alcohol. Certain medical conditions, such as kidney disease, Parkinson's disease, or nerve damage. Certain medicines, such as those for high blood pressure, nausea, colds, allergies, depression, and some heart conditions. What are the signs or symptoms? The main symptom of this condition is uncomfortable sensations in the legs, such as: Pulling. Tingling. Prickling. Throbbing. Crawling. Burning. Usually, the sensations: Affect both sides of the body. Are worse when you sit or lie down. Are worse at night. These may wake you up or make it difficult to fall asleep. Make you have a strong urge to move your legs. Are temporarily relieved by moving your legs. The arms can also be affected, but this is rare. People who have this conditionoften have tiredness during the day because of their lack of sleep at night. How is this diagnosed? This condition may be diagnosed based on: Your symptoms. Blood tests. In some cases, you may be monitored in a sleep lab by a specialist (a sleep study). This can detect any disruptions in your sleep. How is this treated? This condition is treated by managing the symptoms. This may include: Lifestyle changes, such as  exercising, using relaxation techniques, and avoiding caffeine, alcohol, or tobacco. Medicines. Anti-seizure medicines may be tried first. Follow these instructions at home: General instructions Take over-the-counter and prescription medicines only as told by your health care provider. Use methods to help relieve the uncomfortable sensations, such as: Massaging your legs. Walking or stretching. Taking a cold or hot bath. Keep all follow-up visits as told by your health care provider. This is important. Lifestyle     Practice good sleep habits. For example, go to bed and get up at the same time every day. Most adults should get 7-9 hours of sleep each night. Exercise regularly. Try to get at least 30 minutes of exercise most days of the week. Practice ways of relaxing, such as yoga or meditation. Avoid caffeine and alcohol. Do not use any products that contain nicotine or tobacco, such as cigarettes and e-cigarettes. If you need help quitting, ask your health care provider. Contact a health care provider if: Your symptoms get worse or they do not improve with treatment. Summary Restless legs syndrome is a condition that causes uncomfortable feelings or sensations in the legs, especially while sitting or lying down. The symptoms often interfere with a person's ability to sleep. This condition is treated by managing the symptoms. You may need to make lifestyle changes or take medicines. This information is not intended to replace advice given to you by your health care provider. Make sure you discuss any questions you have with your healthcare provider. Document Revised: 05/07/2019 Document Reviewed: 04/07/2017 Elsevier Patient Education  2022 [employer]  

## 2021-01-31 NOTE — Progress Notes (Signed)
..Virtual Visit via Video Note  I connected with Sherry Yang on 01/31/21 at  7:10 AM EDT by a video enabled telemedicine application and verified that I am speaking with the correct person using two identifiers.  Location: Patient: home Provider: clinic  .Marland KitchenParticipating in visit:  Patient: Sherry Yang Provider: Iran Planas PA-C   I discussed the limitations of evaluation and management by telemedicine and the availability of in person appointments. The patient expressed understanding and agreed to proceed.  History of Present Illness: Pt is a 20 yo female who presents to the clinic to discuss restless legs at night. She does not have this every night but she does have it often. This week she has been more stressed and it has been worse. Last night she could not sleep. Her mother and aunt have RLS. They take requip. Her great aunt was just dx with parkinsons disease. Pt is worried about that. Not tried anything to make better.   .. Active Ambulatory Problems    Diagnosis Date Noted   Gastritis without bleeding 08/16/2015   Duodenitis 07/05/2015   IBD (inflammatory bowel disease) 03/30/2014   Irregular menses 11/26/2014   Moderate episode of recurrent major depressive disorder ([place]) 11/26/2014   Attention deficit hyperactivity disorder (ADHD), predominantly inattentive type 11/20/2016   Chronic midline thoracic back pain 11/20/2016   History of childhood obesity 11/20/2016   History of syncope 03/07/2017   Postural dizziness with near syncope 03/07/2017   Acne vulgaris 03/07/2017   Need for HPV vaccination 06/25/2017   Axillary adenopathy 11/30/2018   Anxiety 02/03/2019   GAD (generalized anxiety disorder) 09/21/2019   Depression, recurrent ([place]) 09/21/2019   Trouble in sleeping 09/21/2019   Tonsillith 08/09/2020   Birth control counseling 08/09/2020   Nodule of skin of left lower leg 08/09/2020   Vulvar irritation 11/15/2020   COVID-19 virus infection 11/28/2020   Family  history of restless legs syndrome 01/31/2021   RLS (restless legs syndrome) 01/31/2021   Family history of Parkinson disease 01/31/2021   Resolved Ambulatory Problems    Diagnosis Date Noted   Esophagitis 07/05/2015   Obesity 08/16/2015   Encounter for medication management in attention deficit hyperactivity disorder (ADHD) 03/04/2017   Past Medical History:  Diagnosis Date   ADHD    Chronic gastritis    Depression    Duodenitis determined by biopsy    Inflammatory bowel disease    Obesity, pediatric    Snoring       Observations/Objective: No acute distress Normal mood and appearance  .Marland Kitchen Today's Vitals   01/31/21 0706  Weight: 130 lb (59 kg)  Height: 5\' 4"  (1.626 m)   Body mass index is 22.31 kg/m.    Assessment and Plan: Marland KitchenMarland KitchenDiagnoses and all orders for this visit:  RLS (restless legs syndrome) -     rOPINIRole (REQUIP) 0.25 MG tablet; Take 1 to 3 tablets at bedtime for restless leg.  Family history of restless legs syndrome  Family history of Parkinson disease  Discussed triggers of RLS. HO printed.  Consider starting MVI with iron in it. Increase iron rich foods.  Started as needed requip 1-3 tablets as needed.  Discussed link btw RLS and parkinson. Right now she has no other symptoms. She could consider genetic testing.  Will hold on testing or further work up.     Follow Up Instructions:    I discussed the assessment and treatment plan with the patient. The patient was provided an opportunity to ask questions and all were  answered. The patient agreed with the plan and demonstrated an understanding of the instructions.   The patient was advised to call back or seek an in-person evaluation if the symptoms worsen or if the condition fails to improve as anticipated.     Tandy Gaw, PA-C

## 2021-01-31 NOTE — Progress Notes (Signed)
Not diagnosed with RLS States had symptoms for years, getting worse Family has RLS - mom, aunts Trouble sleeping

## 2021-03-21 ENCOUNTER — Telehealth (INDEPENDENT_AMBULATORY_CARE_PROVIDER_SITE_OTHER): Payer: Managed Care, Other (non HMO) | Admitting: Physician Assistant

## 2021-03-21 ENCOUNTER — Encounter: Payer: Self-pay | Admitting: Physician Assistant

## 2021-03-21 VITALS — Ht 64.0 in | Wt 135.0 lb

## 2021-03-21 DIAGNOSIS — F419 Anxiety disorder, unspecified: Secondary | ICD-10-CM | POA: Diagnosis not present

## 2021-03-21 DIAGNOSIS — F900 Attention-deficit hyperactivity disorder, predominantly inattentive type: Secondary | ICD-10-CM

## 2021-03-21 DIAGNOSIS — F331 Major depressive disorder, recurrent, moderate: Secondary | ICD-10-CM | POA: Diagnosis not present

## 2021-03-21 DIAGNOSIS — G2581 Restless legs syndrome: Secondary | ICD-10-CM | POA: Diagnosis not present

## 2021-03-21 MED ORDER — LISDEXAMFETAMINE DIMESYLATE 40 MG PO CAPS: 40.0000 mg | ORAL_CAPSULE | ORAL | 0 refills | Status: DC

## 2021-03-21 NOTE — Progress Notes (Signed)
Needs refills Vyvanse - one here and two in [place]  PHQ9 (9) -GAD7 (13) completed.

## 2021-03-21 NOTE — Progress Notes (Signed)
..Virtual Visit via Video Note  I connected with Sherry Yang on 03/21/21 at  9:50 AM EST by a video enabled telemedicine application and verified that I am speaking with the correct person using two identifiers.  Location: Patient: home Provider: clinic  .Marland KitchenParticipating in visit:  Patient: [name] Provider: Tandy Gaw PA-C   I discussed the limitations of evaluation and management by telemedicine and the availability of in person appointments. The patient expressed understanding and agreed to proceed.  History of Present Illness: [name], [name], anxiety, RLS who presents to the clinic for medication refills.   Pt is doing well on vyvanse. No concerns. Sleeping well and doing well in school.   Her RLS is controlled with early bed time and requip prn.   Depression and anxiety is ok. Does not want medication.     Observations/Objective: No acute distress Normal mood and appearance Normal breathing  .Marland Kitchen Today's Vitals   03/21/21 0929  Weight: 135 lb (61.2 kg)  Height: 5\' 4"  (1.626 m)   Body mass index is 23.17 kg/m.  .. Depression screen [facility] 2/9 03/21/2021 11/15/2020 06/27/2020 02/08/2020 09/21/2019  Decreased Interest 2 0 3 0 2  Down, Depressed, Hopeless 0 0 0 0 2  PHQ - 2 Score 2 0 3 0 4  Altered sleeping 3 0 1 - 3  Tired, decreased energy 3 0 3 - 3  Change in appetite 1 0 1 - 3  Feeling bad or failure about yourself  0 0 0 - 0  Trouble concentrating 0 0 3 - 2  Moving slowly or fidgety/restless 0 0 3 - 0  Suicidal thoughts 0 0 0 - 0  PHQ-9 Score 9 0 14 - 15  Difficult doing work/chores Somewhat difficult Not difficult at all Very difficult - Very difficult  Some recent data might be hidden   .09/23/2019 GAD 7 : Generalized Anxiety Score 03/21/2021 11/15/2020 06/27/2020 09/21/2019  Nervous, Anxious, on Edge 2 2 3 3   Control/stop worrying 3 1 3 3   Worry too much - different things 3 2 3 3   Trouble relaxing 2 0 3 3  Restless 1 0 1 3  Easily annoyed or  irritable 2 1 3 3   Afraid - awful might happen 0 0 0 1  Total GAD 7 Score 13 6 16 19   Anxiety Difficulty Somewhat difficult Not difficult at all Somewhat difficult Very difficult       Assessment and Plan: 09/23/2019 [name] was seen today for follow-up.  Diagnoses and all orders for this visit:  Moderate episode of recurrent major depressive disorder (HCC)  Attention deficit hyperactivity disorder (ADHD), predominantly inattentive type -     lisdexamfetamine (VYVANSE) 40 MG capsule; Take 1 capsule (40 mg total) by mouth every morning. -     lisdexamfetamine (VYVANSE) 40 MG capsule; Take 1 capsule (40 mg total) by mouth every morning. -     lisdexamfetamine (VYVANSE) 40 MG capsule; Take 1 capsule (40 mg total) by mouth every morning.  RLS (restless legs syndrome)  Anxiety      Follow Up Instructions:    I discussed the assessment and treatment plan with the patient. The patient was provided an opportunity to ask questions and all were answered. The patient agreed with the plan and demonstrated an understanding of the instructions.   The patient was advised to call back or seek an in-person evaluation if the symptoms worsen or if the condition fails to improve as anticipated.  Iran Planas, PA-C

## 2021-04-22 ENCOUNTER — Other Ambulatory Visit: Payer: Self-pay | Admitting: Physician Assistant

## 2021-04-22 DIAGNOSIS — G2581 Restless legs syndrome: Secondary | ICD-10-CM

## 2021-04-26 ENCOUNTER — Other Ambulatory Visit: Payer: Self-pay | Admitting: Physician Assistant

## 2021-04-26 DIAGNOSIS — F419 Anxiety disorder, unspecified: Secondary | ICD-10-CM

## 2021-05-14 ENCOUNTER — Telehealth: Payer: Self-pay | Admitting: Physician Assistant

## 2021-05-14 NOTE — Telephone Encounter (Signed)
Patients mother dropped of a medical form to be completed by PCP. Paperwork is in providers box. Patients mother was informed of a possible fee and 3-5 day turn around - lmr.

## 2021-05-14 NOTE — Telephone Encounter (Signed)
FYI

## 2021-05-14 NOTE — Telephone Encounter (Signed)
Patient assistance form - signed and at the front for patient's mother to pick back up. She is aware.

## 2021-06-19 ENCOUNTER — Telehealth (INDEPENDENT_AMBULATORY_CARE_PROVIDER_SITE_OTHER): Payer: Managed Care, Other (non HMO) | Admitting: Physician Assistant

## 2021-06-19 ENCOUNTER — Encounter: Payer: Self-pay | Admitting: Physician Assistant

## 2021-06-19 DIAGNOSIS — F419 Anxiety disorder, unspecified: Secondary | ICD-10-CM | POA: Diagnosis not present

## 2021-06-19 DIAGNOSIS — F439 Reaction to severe stress, unspecified: Secondary | ICD-10-CM

## 2021-06-19 DIAGNOSIS — F331 Major depressive disorder, recurrent, moderate: Secondary | ICD-10-CM | POA: Diagnosis not present

## 2021-06-19 MED ORDER — BUSPIRONE HCL 10 MG PO TABS: 10.0000 mg | ORAL_TABLET | Freq: Two times a day (BID) | ORAL | 1 refills | Status: DC

## 2021-06-19 NOTE — Progress Notes (Signed)
..Virtual Visit via Video Note ? ?I connected with Sherry Yang on 06/19/21 at 10:30 AM EDT by a video enabled telemedicine application and verified that I am speaking with the correct person using two identifiers. ? ?Location: ?Patient: school ?Provider: clinic ? ?Marland Kitchen.Participating in visit:  ?Patient: Sherry Yang ?Provider: Tandy Gaw PA-C ?Provider in training: Joyice Faster PA-S ?  ?I discussed the limitations of evaluation and management by telemedicine and the availability of in person appointments. The patient expressed understanding and agreed to proceed. ? ?History of Present Illness: ?Patient is a 21 year old female with [name], GAD, stress who calls into the clinic to discuss medications.  She has been on Prozac, Celexa, BuSpar before for her anxiety and depression and has done well.  She did not like Prozac because of the weight gain.  She is unsure why she stopped Celexa and BuSpar.  She denies any suicidal thoughts or homicidal idealizations.  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  This is caused a lot of anxiety and stress.  This is also caused some issues with her confidence.  She does finds more more that she is irritable, anxious, overwhelmed.  She would like to try something to help with this. ? ? prozac/celexa/buspar ?.. ?Active Ambulatory Problems  ?  Diagnosis Date Noted  ? Gastritis without bleeding 08/16/2015  ? Duodenitis 07/05/2015  ? IBD (inflammatory bowel disease) 03/30/2014  ? Irregular menses 11/26/2014  ? Moderate episode of recurrent major depressive disorder (HCC) 11/26/2014  ? Attention deficit hyperactivity disorder (ADHD), predominantly inattentive type 11/20/2016  ? Chronic midline thoracic back pain 11/20/2016  ? History of childhood obesity 11/20/2016  ? History of syncope 03/07/2017  ? Postural dizziness with near syncope 03/07/2017  ? Acne vulgaris 03/07/2017   ? Need for HPV vaccination 06/25/2017  ? Axillary adenopathy 11/30/2018  ? Anxiety 02/03/2019  ? GAD (generalized anxiety disorder) 09/21/2019  ? Depression, recurrent (HCC) 09/21/2019  ? Trouble in sleeping 09/21/2019  ? Tonsillith 08/09/2020  ? Birth control counseling 08/09/2020  ? Nodule of skin of left lower leg 08/09/2020  ? Vulvar irritation 11/15/2020  ? COVID-19 virus infection 11/28/2020  ? Family history of restless legs syndrome 01/31/2021  ? RLS (restless legs syndrome) 01/31/2021  ? Family history of Parkinson disease 01/31/2021  ? Stress 06/19/2021  ? ?Resolved Ambulatory Problems  ?  Diagnosis Date Noted  ? Esophagitis 07/05/2015  ? Obesity 08/16/2015  ? Encounter for medication management in attention deficit hyperactivity disorder (ADHD) 03/04/2017  ? ?Past Medical History:  ?Diagnosis Date  ? ADHD   ? Chronic gastritis   ? Depression   ? Duodenitis determined by biopsy   ? Inflammatory bowel disease   ? Obesity, pediatric   ? Snoring   ? ? ?Observations/Objective: ?No acute distress ?Normal mood and appearance ? ?Marland Kitchen.There were no vitals filed for this visit. ?There is no height or weight on file to calculate BMI. ? ? ?.. ?Depression screen [facility] 2/9 06/19/2021 03/21/2021 11/15/2020 06/27/2020 02/08/2020  ?Decreased Interest 2 2 0 3 0  ?Down, Depressed, Hopeless 1 0 0 0 0  ?PHQ - 2 Score 3 2 0 3 0  ?Altered sleeping 1 3 0 1 -  ?Tired, decreased energy 2 3 0 3 -  ?Change in appetite 0 1 0 1 -  ?Feeling bad or failure about yourself  0 0  0 0 -  ?Trouble concentrating 2 0 0 3 -  ?Moving slowly or fidgety/restless 1 0 0 3 -  ?Suicidal thoughts 0 0 0 0 -  ?PHQ-9 Score 9 9 0 14 -  ?Difficult doing work/chores Somewhat difficult Somewhat difficult Not difficult at all Very difficult -  ?Some recent data might be hidden  ? ?.. ?GAD 7 : Generalized Anxiety Score 06/19/2021 03/21/2021 11/15/2020 06/27/2020  ?Nervous, Anxious, on Edge 3 2 2 3   ?Control/stop worrying 3 3 1 3   ?Worry too much - different things 3 3 2 3    ?Trouble relaxing 2 2 0 3  ?Restless 1 1 0 1  ?Easily annoyed or irritable 3 2 1 3   ?Afraid - awful might happen 2 0 0 0  ?Total GAD 7 Score 17 13 6 16   ?Anxiety Difficulty Very difficult Somewhat difficult Not difficult at all Somewhat difficult  ? ? ? ?Assessment and Plan: ?..Temitope was seen today for anxiety. ? ?Diagnoses and all orders for this visit: ? ?Moderate episode of recurrent major depressive disorder (HCC) ? ?Anxiety ?-     busPIRone (BUSPAR) 10 MG tablet; Take 1 tablet (10 mg total) by mouth 2 (two) times daily. ? ?Stress ?-     busPIRone (BUSPAR) 10 MG tablet; Take 1 tablet (10 mg total) by mouth 2 (two) times daily. ? ? ?Pt is hesitant to medications. Start with buspar since she has tried this before and anxiety is the biggest complaint. Discussed taking 1/2 of the 10mg  tablet bid for first 7 days then increase to 1 tablet bid. Ok to increase propranolol before test up to 40mg . Encouraged regular exercise and counseling. ?Follow up in 1 month.  ? ? ? ?Follow Up Instructions: ? ?  ?I discussed the assessment and treatment plan with the patient. The patient was provided an opportunity to ask questions and all were answered. The patient agreed with the plan and demonstrated an understanding of the instructions. ?  ?The patient was advised to call back or seek an in-person evaluation if the symptoms worsen or if the condition fails to improve as anticipated. ? ? ? ? , PA-C ? ?

## 2021-07-11 ENCOUNTER — Other Ambulatory Visit: Payer: Self-pay | Admitting: Physician Assistant

## 2021-07-11 DIAGNOSIS — F439 Reaction to severe stress, unspecified: Secondary | ICD-10-CM

## 2021-07-11 DIAGNOSIS — F419 Anxiety disorder, unspecified: Secondary | ICD-10-CM

## 2021-07-24 ENCOUNTER — Other Ambulatory Visit: Payer: Self-pay | Admitting: Neurology

## 2021-07-24 DIAGNOSIS — F900 Attention-deficit hyperactivity disorder, predominantly inattentive type: Secondary | ICD-10-CM

## 2021-07-24 MED ORDER — LISDEXAMFETAMINE DIMESYLATE 40 MG PO CAPS: 40.0000 mg | ORAL_CAPSULE | ORAL | 0 refills | Status: DC

## 2021-07-24 NOTE — Telephone Encounter (Signed)
Patient needs refills of Vyvanse sent to pharmacy.  ?Last written in February. Last seen in March.  ?This month needs sent to [facility] [place], next two months to [place].  ?RX pended.  ?

## 2021-07-25 ENCOUNTER — Other Ambulatory Visit: Payer: Self-pay | Admitting: Physician Assistant

## 2021-07-25 DIAGNOSIS — F419 Anxiety disorder, unspecified: Secondary | ICD-10-CM

## 2021-08-08 ENCOUNTER — Other Ambulatory Visit: Payer: Self-pay | Admitting: Physician Assistant

## 2021-08-08 DIAGNOSIS — F419 Anxiety disorder, unspecified: Secondary | ICD-10-CM

## 2021-08-08 DIAGNOSIS — F439 Reaction to severe stress, unspecified: Secondary | ICD-10-CM

## 2021-08-22 ENCOUNTER — Other Ambulatory Visit: Payer: Self-pay | Admitting: Physician Assistant

## 2021-08-22 DIAGNOSIS — F419 Anxiety disorder, unspecified: Secondary | ICD-10-CM

## 2021-08-22 DIAGNOSIS — F439 Reaction to severe stress, unspecified: Secondary | ICD-10-CM

## 2021-09-11 ENCOUNTER — Ambulatory Visit: Payer: Managed Care, Other (non HMO) | Admitting: Physician Assistant

## 2021-09-17 ENCOUNTER — Encounter: Payer: Self-pay | Admitting: Physician Assistant

## 2021-09-17 ENCOUNTER — Ambulatory Visit (INDEPENDENT_AMBULATORY_CARE_PROVIDER_SITE_OTHER): Payer: Managed Care, Other (non HMO) | Admitting: Physician Assistant

## 2021-09-17 VITALS — BP 121/65 | HR 92 | Ht 64.0 in | Wt 139.0 lb

## 2021-09-17 DIAGNOSIS — F419 Anxiety disorder, unspecified: Secondary | ICD-10-CM

## 2021-09-17 DIAGNOSIS — F331 Major depressive disorder, recurrent, moderate: Secondary | ICD-10-CM

## 2021-09-17 DIAGNOSIS — F900 Attention-deficit hyperactivity disorder, predominantly inattentive type: Secondary | ICD-10-CM | POA: Diagnosis not present

## 2021-09-17 DIAGNOSIS — F439 Reaction to severe stress, unspecified: Secondary | ICD-10-CM

## 2021-09-17 MED ORDER — VILAZODONE HCL 20 MG PO TABS: 1.0000 | ORAL_TABLET | Freq: Every day | ORAL | 0 refills | Status: DC

## 2021-09-17 MED ORDER — CLONAZEPAM 0.5 MG PO TABS: 0.5000 mg | ORAL_TABLET | Freq: Two times a day (BID) | ORAL | 1 refills | Status: DC | PRN

## 2021-09-17 MED ORDER — LISDEXAMFETAMINE DIMESYLATE 40 MG PO CAPS: 40.0000 mg | ORAL_CAPSULE | ORAL | 0 refills | Status: DC

## 2021-09-17 NOTE — Patient Instructions (Signed)
Viibryd daily and klonapin as needed.

## 2021-09-17 NOTE — Progress Notes (Signed)
Established Patient Office Visit  Subjective   Patient ID: Sherry Yang, female    DOB: 27-Sep-2000  Age: 21 y.o. MRN: [id number]  Chief Complaint  Patient presents with   Anxiety    HPI Pt is a 21 yo female with ADHD, [name], GAD who presents to the clinic to follow up on buspar. It has not helped at all. She has taken for at least a month. She has now stopped it. Her mind is always overthinking. She worries about pleasing everyone. She is in school and worried about grades. She did not like that prozac made her gain 40lbs. She did not like the way celexa made her feel. She knows she has to try something. No SI/HC.   Patient Active Problem List   Diagnosis Date Noted   Stress 06/19/2021   Family history of restless legs syndrome 01/31/2021   RLS (restless legs syndrome) 01/31/2021   Family history of Parkinson disease 01/31/2021   COVID-19 virus infection 11/28/2020   Vulvar irritation 11/15/2020   Tonsillith 08/09/2020   Birth control counseling 08/09/2020   Nodule of skin of left lower leg 08/09/2020   GAD (generalized anxiety disorder) 09/21/2019   Depression, recurrent (HCC) 09/21/2019   Trouble in sleeping 09/21/2019   Anxiety 02/03/2019   Axillary adenopathy 11/30/2018   Need for HPV vaccination 06/25/2017   History of syncope 03/07/2017   Postural dizziness with near syncope 03/07/2017   Acne vulgaris 03/07/2017   Attention deficit hyperactivity disorder (ADHD), predominantly inattentive type 11/20/2016   Chronic midline thoracic back pain 11/20/2016   History of childhood obesity 11/20/2016   Gastritis without bleeding 08/16/2015   Duodenitis 07/05/2015   Irregular menses 11/26/2014   Moderate episode of recurrent major depressive disorder (HCC) 11/26/2014   IBD (inflammatory bowel disease) 03/30/2014   Past Medical History:  Diagnosis Date   ADHD    Anxiety    Chronic gastritis    Depression    Duodenitis determined by biopsy    Inflammatory bowel disease     Irregular menses    Obesity, pediatric    Snoring    No Known Allergies    ROS   See HPI.  Objective:     BP 121/65   Pulse 92   Ht 5\' 4"  (1.626 m)   Wt 139 lb (63 kg)   SpO2 100%   BMI 23.86 kg/m  BP Readings from Last 3 Encounters:  09/17/21 121/65  11/15/20 111/79  08/23/20 130/79    .08/25/20    09/17/2021   11:07 AM 06/19/2021   10:20 AM 03/21/2021    9:29 AM 11/15/2020    8:36 AM 06/27/2020    1:36 PM  Depression screen PHQ 2/9  Decreased Interest 0 2 2 0 3  Down, Depressed, Hopeless 1 1 0 0 0  PHQ - 2 Score 1 3 2  0 3  Altered sleeping 2 1 3  0 1  Tired, decreased energy 3 2 3  0 3  Change in appetite 0 0 1 0 1  Feeling bad or failure about yourself  1 0 0 0 0  Trouble concentrating 0 2 0 0 3  Moving slowly or fidgety/restless 1 1 0 0 3  Suicidal thoughts 0 0 0 0 0  PHQ-9 Score 8 9 9  0 14  Difficult doing work/chores Very difficult Somewhat difficult Somewhat difficult Not difficult at all Very difficult   .4/9    09/17/2021   11:07 AM 06/19/2021   10:23 AM 03/21/2021  9:31 AM 11/15/2020    8:37 AM  GAD 7 : Generalized Anxiety Score  Nervous, Anxious, on Edge 3 3 2 2   Control/stop worrying 3 3 3 1   Worry too much - different things 3 3 3 2   Trouble relaxing 1 2 2  0  Restless 1 1 1  0  Easily annoyed or irritable 2 3 2 1   Afraid - awful might happen 1 2 0 0  Total GAD 7 Score 14 17 13 6   Anxiety Difficulty Extremely difficult Very difficult Somewhat difficult Not difficult at all      Physical Exam Vitals reviewed.  Constitutional:      Appearance: Normal appearance.  Cardiovascular:     Rate and Rhythm: Normal rate.  Pulmonary:     Effort: Pulmonary effort is normal.  Neurological:     General: No focal deficit present.     Mental Status: She is alert and oriented to person, place, and time.  Psychiatric:        Mood and Affect: Mood normal.          Assessment & Plan:   Leyna was seen today for anxiety.  Diagnoses and all orders  for this visit:  Anxiety -     Vilazodone HCl 20 MG TABS; Take 1 tablet (20 mg total) by mouth daily. -     clonazePAM (KLONOPIN) 0.5 MG tablet; Take 1 tablet (0.5 mg total) by mouth 2 (two) times daily as needed for anxiety.  Attention deficit hyperactivity disorder (ADHD), predominantly inattentive type  Moderate episode of recurrent major depressive disorder (HCC) -     Vilazodone HCl 20 MG TABS; Take 1 tablet (20 mg total) by mouth daily.  Stress -     Vilazodone HCl 20 MG TABS; Take 1 tablet (20 mg total) by mouth daily. -     clonazePAM (KLONOPIN) 0.5 MG tablet; Take 1 tablet (0.5 mg total) by mouth 2 (two) times daily as needed for anxiety.   PHQ/GAD numbers not to goal Discussed vibryd vs effexor.  Decided to try Vibyrd.  Klonapin as needed for acute anxiety. Continue to exercise/deep breathing/relaxation techniques Follow up in 4 weeks.    , PA-C

## 2021-10-15 ENCOUNTER — Ambulatory Visit: Payer: Managed Care, Other (non HMO)

## 2021-10-15 ENCOUNTER — Encounter: Payer: Self-pay | Admitting: Physician Assistant

## 2021-10-15 ENCOUNTER — Ambulatory Visit (INDEPENDENT_AMBULATORY_CARE_PROVIDER_SITE_OTHER): Payer: Managed Care, Other (non HMO) | Admitting: Physician Assistant

## 2021-10-15 VITALS — BP 134/73 | HR 128 | Ht 64.0 in | Wt 140.0 lb

## 2021-10-15 DIAGNOSIS — F419 Anxiety disorder, unspecified: Secondary | ICD-10-CM

## 2021-10-15 DIAGNOSIS — S6981XA Other specified injuries of right wrist, hand and finger(s), initial encounter: Secondary | ICD-10-CM

## 2021-10-15 DIAGNOSIS — F900 Attention-deficit hyperactivity disorder, predominantly inattentive type: Secondary | ICD-10-CM | POA: Diagnosis not present

## 2021-10-15 DIAGNOSIS — F439 Reaction to severe stress, unspecified: Secondary | ICD-10-CM | POA: Diagnosis not present

## 2021-10-15 MED ORDER — PROPRANOLOL HCL 10 MG PO TABS: ORAL_TABLET | ORAL | 0 refills | Status: DC

## 2021-10-15 MED ORDER — VILAZODONE HCL 40 MG PO TABS: 40.0000 mg | ORAL_TABLET | Freq: Every day | ORAL | 0 refills | Status: DC

## 2021-10-15 MED ORDER — CLONAZEPAM 1 MG PO TABS: 1.0000 mg | ORAL_TABLET | Freq: Two times a day (BID) | ORAL | 0 refills | Status: DC | PRN

## 2021-10-15 MED ORDER — LISDEXAMFETAMINE DIMESYLATE 30 MG PO CAPS: 30.0000 mg | ORAL_CAPSULE | Freq: Every day | ORAL | 0 refills | Status: DC

## 2021-10-15 NOTE — Progress Notes (Signed)
Great news no fracture. Ice and take anti-inflammatories for the next 2-3 days. Bruising can take longer to resolve.

## 2021-10-15 NOTE — Progress Notes (Deleted)
b

## 2021-10-15 NOTE — Progress Notes (Unsigned)
Established Patient Office Visit  Subjective   Patient ID: Sherry Yang, female    DOB: [birth year]  Age: 21 y.o. MRN: [id number]  Chief Complaint  Patient presents with   Follow-up    HPI Pt is a 21 yo female with Depression, Anxiety, ADHD who presents to the clinic for medication refills. Her mother accompanies her.   Pt started vibryd about 3 weeks ago and has noticed some improvement with depression but not anxiety. In fact even the klonapin 1 and 1/2 tablet does not help anxiety. She feels increasingly overwhelmed and like something bad is going to happen. Propranolol does seem to help some. No side effects. She has this desire to please everyone but always feels like she is not pleasing anyone.   She does need vyvanse for focus to be refilled. She has done well on this for years.   Yesterday she was playing golf and a ball was hit and rebounded off gutter and hit her dorsal right hand. It is very bruised and tender.   .. Active Ambulatory Problems    Diagnosis Date Noted   Gastritis without bleeding 08/16/2015   Duodenitis 07/05/2015   IBD (inflammatory bowel disease) 03/30/2014   Irregular menses 11/26/2014   Moderate episode of recurrent major depressive disorder (HCC) 11/26/2014   Attention deficit hyperactivity disorder (ADHD), predominantly inattentive type 11/20/2016   Chronic midline thoracic back pain 11/20/2016   History of childhood obesity 11/20/2016   History of syncope 03/07/2017   Postural dizziness with near syncope 03/07/2017   Acne vulgaris 03/07/2017   Need for HPV vaccination 06/25/2017   Axillary adenopathy 11/30/2018   Anxiety 02/03/2019   GAD (generalized anxiety disorder) 09/21/2019   Depression, recurrent (HCC) 09/21/2019   Trouble in sleeping 09/21/2019   Tonsillith 08/09/2020   Birth control counseling 08/09/2020   Nodule of skin of left lower leg 08/09/2020   Vulvar irritation 11/15/2020   COVID-19 virus infection 11/28/2020   Family  history of restless legs syndrome 01/31/2021   RLS (restless legs syndrome) 01/31/2021   Family history of Parkinson disease 01/31/2021   Stress 06/19/2021   Blast injury of right hand 10/15/2021   Resolved Ambulatory Problems    Diagnosis Date Noted   Esophagitis 07/05/2015   Obesity 08/16/2015   Encounter for medication management in attention deficit hyperactivity disorder (ADHD) 03/04/2017   Past Medical History:  Diagnosis Date   ADHD    Chronic gastritis    Depression    Duodenitis determined by biopsy    Inflammatory bowel disease    Obesity, pediatric    Snoring      ROS   See HPI.  Objective:     BP 134/73   Pulse (!) 128   Ht 5\' 4"  (1.626 m)   Wt 140 lb (63.5 kg)   SpO2 99%   BMI 24.03 kg/m  BP Readings from Last 3 Encounters:  10/15/21 134/73  09/17/21 121/65  11/15/20 111/79   Wt Readings from Last 3 Encounters:  10/15/21 140 lb (63.5 kg)  09/17/21 139 lb (63 kg)  03/21/21 135 lb (61.2 kg)      Physical Exam Constitutional:      Appearance: Normal appearance.  HENT:     Head: Normocephalic.  Cardiovascular:     Rate and Rhythm: Normal rate and regular rhythm.  Pulmonary:     Effort: Pulmonary effort is normal.     Breath sounds: Normal breath sounds.  Musculoskeletal:     Comments: Right dorsal  hand bruising and swelling over the wrist bones. Tenderness to palpation just above the anatomical snuff box.  NROM Strength 5/5.   Neurological:     General: No focal deficit present.     Mental Status: She is alert and oriented to person, place, and time.  Psychiatric:        Mood and Affect: Mood normal.       ..    10/15/2021    2:23 PM 09/17/2021   11:07 AM 06/19/2021   10:20 AM 03/21/2021    9:29 AM 11/15/2020    8:36 AM  Depression screen PHQ 2/9  Decreased Interest 0 0 2 2 0  Down, Depressed, Hopeless 1 1 1  0 0  PHQ - 2 Score 1 1 3 2  0  Altered sleeping 1 2 1 3  0  Tired, decreased energy 1 3 2 3  0  Change in appetite 0 0 0 1  0  Feeling bad or failure about yourself  1 1 0 0 0  Trouble concentrating 0 0 2 0 0  Moving slowly or fidgety/restless 0 1 1 0 0  Suicidal thoughts 0 0 0 0 0  PHQ-9 Score 4 8 9 9  0  Difficult doing work/chores Somewhat difficult Very difficult Somewhat difficult Somewhat difficult Not difficult at all   .    10/15/2021    2:23 PM 09/17/2021   11:07 AM 06/19/2021   10:23 AM 03/21/2021    9:31 AM  GAD 7 : Generalized Anxiety Score  Nervous, Anxious, on Edge 3 3 3 2   Control/stop worrying 3 3 3 3   Worry too much - different things 3 3 3 3   Trouble relaxing 2 1 2 2   Restless 1 1 1 1   Easily annoyed or irritable 2 2 3 2   Afraid - awful might happen 1 1 2  0  Total GAD 7 Score 15 14 17 13   Anxiety Difficulty Very difficult Extremely difficult Very difficult Somewhat difficult       Assessment & Plan:   Marland Kitchen7/19/2023Christianne was seen today for follow-up.  Diagnoses and all orders for this visit:  Attention deficit hyperactivity disorder (ADHD), predominantly inattentive type -     lisdexamfetamine (VYVANSE) 30 MG capsule; Take 1 capsule (30 mg total) by mouth daily.  Anxiety -     Vilazodone HCl (VIIBRYD) 40 MG TABS; Take 1 tablet (40 mg total) by mouth daily. -     propranolol (INDERAL) 10 MG tablet; TAKE 1-2 TABLETS BY MOUTH up to three time -     clonazePAM (KLONOPIN) 1 MG tablet; Take 1 tablet (1 mg total) by mouth 2 (two) times daily as needed for anxiety.  Stress -     Vilazodone HCl (VIIBRYD) 40 MG TABS; Take 1 tablet (40 mg total) by mouth daily.  Blast injury of right hand -     DG Hand Complete Right; Future   PHQ improved from 8 to 4 GAD stayed at 15 Increased vibryd to 40mg  Decreased vyvanse to 30mg  Increased propranolol to TID Increased as needed klonapin to 1mg  but discussed to continue to take sparingly and only has needed I am wanting to see if decreasing stimulant helps with any of her anxiety Declined counseling stating it did not help in the past  Wrist/Hand  injury Will get xray to rule out fracture.  Discussed rest/ice/compress and NSAIDs  Needs pap smear   06/21/2021, PA-C

## 2021-10-22 ENCOUNTER — Other Ambulatory Visit: Payer: Self-pay | Admitting: Physician Assistant

## 2021-10-22 DIAGNOSIS — L700 Acne vulgaris: Secondary | ICD-10-CM

## 2021-10-22 DIAGNOSIS — Z3009 Encounter for other general counseling and advice on contraception: Secondary | ICD-10-CM

## 2021-11-01 ENCOUNTER — Encounter: Payer: Self-pay | Admitting: Neurology

## 2021-11-13 ENCOUNTER — Encounter: Payer: Self-pay | Admitting: Physician Assistant

## 2021-11-23 ENCOUNTER — Encounter: Payer: Self-pay | Admitting: Physician Assistant

## 2021-11-23 ENCOUNTER — Telehealth (INDEPENDENT_AMBULATORY_CARE_PROVIDER_SITE_OTHER): Payer: Managed Care, Other (non HMO) | Admitting: Physician Assistant

## 2021-11-23 VITALS — Ht 64.0 in | Wt 137.0 lb

## 2021-11-23 DIAGNOSIS — F900 Attention-deficit hyperactivity disorder, predominantly inattentive type: Secondary | ICD-10-CM

## 2021-11-23 DIAGNOSIS — F331 Major depressive disorder, recurrent, moderate: Secondary | ICD-10-CM | POA: Diagnosis not present

## 2021-11-23 DIAGNOSIS — N760 Acute vaginitis: Secondary | ICD-10-CM

## 2021-11-23 DIAGNOSIS — F411 Generalized anxiety disorder: Secondary | ICD-10-CM | POA: Diagnosis not present

## 2021-11-23 MED ORDER — VYVANSE 40 MG PO CAPS: 40.0000 mg | ORAL_CAPSULE | ORAL | 0 refills | Status: DC

## 2021-11-23 MED ORDER — BUPROPION HCL ER (XL) 150 MG PO TB24: 150.0000 mg | ORAL_TABLET | ORAL | 0 refills | Status: DC

## 2021-11-23 MED ORDER — FLUCONAZOLE 150 MG PO TABS: 150.0000 mg | ORAL_TABLET | Freq: Once | ORAL | 0 refills | Status: AC

## 2021-11-23 NOTE — Progress Notes (Signed)
..Virtual Visit via Video Note  I connected with Sherry Yang on 11/23/21 at  2:20 PM EDT by a video enabled telemedicine application and verified that I am speaking with the correct person using two identifiers.  Location: Patient: home Provider: clinic  .Marland KitchenParticipating in visit:  Patient: Sherry Yang Provider: Tandy Gaw PA-C   I discussed the limitations of evaluation and management by telemedicine and the availability of in person appointments. The patient expressed understanding and agreed to proceed.  History of Present Illness: Pt is a 21 yo female with GAD, [name], ADHD who needs refills.   Vyvanse works better at 40mg  and seems to make it through the day better. No other issues or concerns.   Viibryd made her RLS much worse. She would like to try wellbutrin.   .. Active Ambulatory Problems    Diagnosis Date Noted   Gastritis without bleeding 08/16/2015   Duodenitis 07/05/2015   IBD (inflammatory bowel disease) 03/30/2014   Irregular menses 11/26/2014   Moderate episode of recurrent major depressive disorder (HCC) 11/26/2014   Attention deficit hyperactivity disorder (ADHD), predominantly inattentive type 11/20/2016   Chronic midline thoracic back pain 11/20/2016   History of childhood obesity 11/20/2016   History of syncope 03/07/2017   Postural dizziness with near syncope 03/07/2017   Acne vulgaris 03/07/2017   Need for HPV vaccination 06/25/2017   Axillary adenopathy 11/30/2018   Anxiety 02/03/2019   GAD (generalized anxiety disorder) 09/21/2019   Depression, recurrent (HCC) 09/21/2019   Trouble in sleeping 09/21/2019   Tonsillith 08/09/2020   Birth control counseling 08/09/2020   Nodule of skin of left lower leg 08/09/2020   Vulvar irritation 11/15/2020   COVID-19 virus infection 11/28/2020   Family history of restless legs syndrome 01/31/2021   RLS (restless legs syndrome) 01/31/2021   Family history of Parkinson disease 01/31/2021   Stress 06/19/2021    Blast injury of right hand 10/15/2021   Resolved Ambulatory Problems    Diagnosis Date Noted   Esophagitis 07/05/2015   Obesity 08/16/2015   Encounter for medication management in attention deficit hyperactivity disorder (ADHD) 03/04/2017   Past Medical History:  Diagnosis Date   ADHD    Chronic gastritis    Depression    Duodenitis determined by biopsy    Inflammatory bowel disease    Obesity, pediatric    Snoring        Observations/Objective: No acute distress Normal mood and appearance  .14/06/2016 Today's Vitals   11/23/21 1358  Weight: 137 lb (62.1 kg)  Height: 5\' 4"  (1.626 m)   Body mass index is 23.52 kg/m.    Assessment and Plan: 11/25/21 Sherry Yang was seen today for follow-up.  Diagnoses and all orders for this visit:  Moderate episode of recurrent major depressive disorder (HCC) -     buPROPion (WELLBUTRIN XL) 150 MG 24 hr tablet; Take 1 tablet (150 mg total) by mouth every morning.  Attention deficit hyperactivity disorder (ADHD), predominantly inattentive type -     VYVANSE 40 MG capsule; Take 1 capsule (40 mg total) by mouth every morning. -     VYVANSE 40 MG capsule; Take 1 capsule (40 mg total) by mouth every morning. -     VYVANSE 40 MG capsule; Take 1 capsule (40 mg total) by mouth every morning.  Acute vaginitis -     fluconazole (DIFLUCAN) 150 MG tablet; Take 1 tablet (150 mg total) by mouth once for 1 dose. Repeat as needed if symptoms persist.  GAD (generalized anxiety disorder) -  buPROPion (WELLBUTRIN XL) 150 MG 24 hr tablet; Take 1 tablet (150 mg total) by mouth every morning.   GAD and Depression not controlled Will try wellbutrin Follow up in 4 weeks  Vyvanse 40mg  sent for 3 months  Dilfucan sent for yeast infection    Follow Up Instructions:    I discussed the assessment and treatment plan with the patient. The patient was provided an opportunity to ask questions and all were answered. The patient agreed with the plan and demonstrated  an understanding of the instructions.   The patient was advised to call back or seek an in-person evaluation if the symptoms worsen or if the condition fails to improve as anticipated.     , PA-C

## 2021-11-23 NOTE — Progress Notes (Signed)
Pt would like a refill on vyvanse 30 mg

## 2021-11-26 ENCOUNTER — Encounter: Payer: Self-pay | Admitting: Physician Assistant

## 2021-11-26 ENCOUNTER — Other Ambulatory Visit: Payer: Self-pay | Admitting: Physician Assistant

## 2021-11-26 MED ORDER — DEXAMETHASONE 4 MG PO TABS: 4.0000 mg | ORAL_TABLET | Freq: Two times a day (BID) | ORAL | 0 refills | Status: DC

## 2021-12-06 ENCOUNTER — Encounter: Payer: Self-pay | Admitting: Physician Assistant

## 2022-02-13 ENCOUNTER — Other Ambulatory Visit: Payer: Self-pay | Admitting: Physician Assistant

## 2022-02-13 DIAGNOSIS — F411 Generalized anxiety disorder: Secondary | ICD-10-CM

## 2022-02-13 DIAGNOSIS — F331 Major depressive disorder, recurrent, moderate: Secondary | ICD-10-CM

## 2022-03-18 ENCOUNTER — Other Ambulatory Visit: Payer: Self-pay | Admitting: Neurology

## 2022-03-18 DIAGNOSIS — F900 Attention-deficit hyperactivity disorder, predominantly inattentive type: Secondary | ICD-10-CM

## 2022-03-18 NOTE — Telephone Encounter (Signed)
Patient called for Vyvanse refill.   Last written in October for one month.   Last appt in August, to follow up in 3 months.   Pended one month, sent to front desk to schedule follow up.   Please advise on refill.

## 2022-03-19 ENCOUNTER — Encounter: Payer: Self-pay | Admitting: Physician Assistant

## 2022-03-19 ENCOUNTER — Ambulatory Visit (INDEPENDENT_AMBULATORY_CARE_PROVIDER_SITE_OTHER): Payer: Managed Care, Other (non HMO) | Admitting: Physician Assistant

## 2022-03-19 VITALS — BP 134/83 | HR 86 | Ht 64.0 in | Wt 146.0 lb

## 2022-03-19 DIAGNOSIS — F439 Reaction to severe stress, unspecified: Secondary | ICD-10-CM

## 2022-03-19 DIAGNOSIS — G2581 Restless legs syndrome: Secondary | ICD-10-CM

## 2022-03-19 DIAGNOSIS — F419 Anxiety disorder, unspecified: Secondary | ICD-10-CM

## 2022-03-19 DIAGNOSIS — F331 Major depressive disorder, recurrent, moderate: Secondary | ICD-10-CM | POA: Diagnosis not present

## 2022-03-19 DIAGNOSIS — F411 Generalized anxiety disorder: Secondary | ICD-10-CM | POA: Diagnosis not present

## 2022-03-19 DIAGNOSIS — L700 Acne vulgaris: Secondary | ICD-10-CM

## 2022-03-19 DIAGNOSIS — F900 Attention-deficit hyperactivity disorder, predominantly inattentive type: Secondary | ICD-10-CM | POA: Diagnosis not present

## 2022-03-19 DIAGNOSIS — Z3009 Encounter for other general counseling and advice on contraception: Secondary | ICD-10-CM

## 2022-03-19 MED ORDER — VYVANSE 40 MG PO CAPS: 40.0000 mg | ORAL_CAPSULE | ORAL | 0 refills | Status: DC

## 2022-03-19 MED ORDER — PROPRANOLOL HCL 10 MG PO TABS: ORAL_TABLET | ORAL | 0 refills | Status: DC

## 2022-03-19 MED ORDER — NORGESTIM-ETH ESTRAD TRIPHASIC 0.18/0.215/0.25 MG-25 MCG PO TABS: 1.0000 | ORAL_TABLET | Freq: Every day | ORAL | 2 refills | Status: DC

## 2022-03-19 MED ORDER — CLONAZEPAM 1 MG PO TABS: 1.0000 mg | ORAL_TABLET | Freq: Two times a day (BID) | ORAL | 0 refills | Status: DC | PRN

## 2022-03-19 NOTE — Progress Notes (Unsigned)
Established Patient Office Visit  Subjective   Patient ID: Sherry Yang, female    DOB: [birth year]  Age: 21 y.o. MRN: [id number]  Chief Complaint  Patient presents with   Follow-up    HPI Pt is a 21 yo female with ADHD, [name], GAD, RLS, Acne who presents to the clinic for medication refills.   Brand vyvanse is doing gret for work and school. No concerns or complaints. Her mood has improved some but anxiety still problematic. Wellbutrin not helping at all and stopped it for the last month. Propranol helps when she takes it. It also helps with RLS symptoms. No SI/HC.   She needs refills on OCP.   .. Active Ambulatory Problems    Diagnosis Date Noted   Gastritis without bleeding 08/16/2015   Duodenitis 07/05/2015   IBD (inflammatory bowel disease) 03/30/2014   Irregular menses 11/26/2014   Moderate episode of recurrent major depressive disorder (HCC) 11/26/2014   Attention deficit hyperactivity disorder (ADHD), predominantly inattentive type 11/20/2016   Chronic midline thoracic back pain 11/20/2016   History of childhood obesity 11/20/2016   History of syncope 03/07/2017   Postural dizziness with near syncope 03/07/2017   Acne vulgaris 03/07/2017   Need for HPV vaccination 06/25/2017   Axillary adenopathy 11/30/2018   Anxiety 02/03/2019   GAD (generalized anxiety disorder) 09/21/2019   Depression, recurrent (HCC) 09/21/2019   Trouble in sleeping 09/21/2019   Tonsillith 08/09/2020   Birth control counseling 08/09/2020   Nodule of skin of left lower leg 08/09/2020   Vulvar irritation 11/15/2020   COVID-19 virus infection 11/28/2020   Family history of restless legs syndrome 01/31/2021   RLS (restless legs syndrome) 01/31/2021   Family history of Parkinson disease 01/31/2021   Stress 06/19/2021   Blast injury of right hand 10/15/2021   Resolved Ambulatory Problems    Diagnosis Date Noted   Esophagitis 07/05/2015   Obesity 08/16/2015   Encounter for medication  management in attention deficit hyperactivity disorder (ADHD) 03/04/2017   Past Medical History:  Diagnosis Date   ADHD    Chronic gastritis    Depression    Duodenitis determined by biopsy    Inflammatory bowel disease    Obesity, pediatric    Snoring      ROS See HPI.    Objective:     BP 134/83   Pulse 86   Ht 5\' 4"  (1.626 m)   Wt 146 lb (66.2 kg)   SpO2 100%   BMI 25.06 kg/m  BP Readings from Last 3 Encounters:  03/19/22 134/83  10/15/21 134/73  09/17/21 121/65   Wt Readings from Last 3 Encounters:  03/19/22 146 lb (66.2 kg)  11/23/21 137 lb (62.1 kg)  10/15/21 140 lb (63.5 kg)    ..    03/19/2022    2:34 PM 11/23/2021    2:01 PM 11/23/2021    1:58 PM 10/15/2021    2:23 PM 09/17/2021   11:07 AM  Depression screen PHQ 2/9  Decreased Interest 2 2 2  0 0  Down, Depressed, Hopeless 1 2 1 1 1   PHQ - 2 Score 3 4 3 1 1   Altered sleeping 1 3 3 1 2   Tired, decreased energy 1 3 3 1 3   Change in appetite 0 0 0 0 0  Feeling bad or failure about yourself  0 1 1 1 1   Trouble concentrating 1 1 1  0 0  Moving slowly or fidgety/restless 0 0 0 0 1  Suicidal thoughts 0  0 0 0 0  PHQ-9 Score 6 12 11 4 8   Difficult doing work/chores Somewhat difficult Somewhat difficult  Somewhat difficult Very difficult   ..    03/19/2022    2:36 PM 11/23/2021    2:00 PM 10/15/2021    2:23 PM 09/17/2021   11:07 AM  GAD 7 : Generalized Anxiety Score  Nervous, Anxious, on Edge 2 3 3 3   Control/stop worrying 2 3 3 3   Worry too much - different things 2 3 3 3   Trouble relaxing 1 3 2 1   Restless 0 2 1 1   Easily annoyed or irritable 1 2 2 2   Afraid - awful might happen 0 1 1 1   Total GAD 7 Score 8 17 15 14   Anxiety Difficulty Somewhat difficult Somewhat difficult Very difficult Extremely difficult      Physical Exam Vitals reviewed.  Constitutional:      Appearance: Normal appearance.  HENT:     Head: Normocephalic.  Cardiovascular:     Rate and Rhythm: Normal rate and regular  rhythm.  Pulmonary:     Effort: Pulmonary effort is normal.  Neurological:     Mental Status: She is alert.  Psychiatric:        Mood and Affect: Mood normal.         Assessment & Plan:  09/19/2021 Natividad was seen today for follow-up.  Diagnoses and all orders for this visit:  Attention deficit hyperactivity disorder (ADHD), predominantly inattentive type -     Discontinue: VYVANSE 40 MG capsule; Take 1 capsule (40 mg total) by mouth every morning. -     VYVANSE 40 MG capsule; Take 1 capsule (40 mg total) by mouth every morning. -     VYVANSE 40 MG capsule; Take 1 capsule (40 mg total) by mouth every morning.  GAD (generalized anxiety disorder)  RLS (restless legs syndrome)  Moderate episode of recurrent major depressive disorder (HCC)  Stress  Anxiety -     clonazePAM (KLONOPIN) 1 MG tablet; Take 1 tablet (1 mg total) by mouth 2 (two) times daily as needed for anxiety. -     propranolol (INDERAL) 10 MG tablet; TAKE 1-2 TABLETS BY MOUTH up to three time  Acne vulgaris -     Norgestimate-Ethinyl Estradiol Triphasic (TRI-LO-MARZIA) 0.18/0.215/0.25 MG-25 MCG tab; Take 1 tablet by mouth daily.  Birth control counseling -     Norgestimate-Ethinyl Estradiol Triphasic (TRI-LO-MARZIA) 0.18/0.215/0.25 MG-25 MCG tab; Take 1 tablet by mouth daily.   Refilled OCP but patient aware she needs Pap smear, per patient she will call and schedule.   Refilled vyvanse BRAND only for ADHD PHQ/GAD appear like they have improved quite a bit Discussed using propranolol at least bid to help with anxiety since helping her as needed Klonapin for prn use     , PA-C

## 2022-03-26 ENCOUNTER — Ambulatory Visit (INDEPENDENT_AMBULATORY_CARE_PROVIDER_SITE_OTHER): Payer: Managed Care, Other (non HMO) | Admitting: Physician Assistant

## 2022-03-26 ENCOUNTER — Encounter: Payer: Self-pay | Admitting: Physician Assistant

## 2022-03-26 ENCOUNTER — Other Ambulatory Visit (HOSPITAL_COMMUNITY)
Admission: RE | Admit: 2022-03-26 | Discharge: 2022-03-26 | Disposition: A | Discharge status: Home / Self Care | Payer: Managed Care, Other (non HMO) | Source: Ambulatory Visit | Attending: Physician Assistant | Admitting: Physician Assistant

## 2022-03-26 VITALS — BP 105/69 | HR 90 | Ht 64.0 in | Wt 148.0 lb

## 2022-03-26 DIAGNOSIS — Z124 Encounter for screening for malignant neoplasm of cervix: Secondary | ICD-10-CM | POA: Diagnosis present

## 2022-03-26 NOTE — Progress Notes (Signed)
Subjective:     Sherry Yang is a 21 y.o. woman who comes in today for a  pap smear only. Her most recent annual exam was on 03/2022. Her most recent Pap smear has never been done. and showed Previous abnormal Pap smears: n/a. Contraception: OCP (estrogen/progesterone)  Pt is sexually active. No concerns.   The following portions of the patient's history were reviewed and updated as appropriate: allergies, current medications, past family history, past medical history, past social history, past surgical history, and problem list.  Review of Systems A comprehensive review of systems was negative.   Objective:    BP 105/69   Pulse 90   Ht 5\' 4"  (1.626 m)   Wt 148 lb (67.1 kg)   SpO2 100%   BMI 25.40 kg/m  Pelvic Exam: cervix normal in appearance, external genitalia normal, and vagina normal without discharge. Pap smear obtained.   Assessment:    Screening pap smear.   Plan:    Follow up in 1 year, or as indicated by Pap results.

## 2022-03-27 LAB — CYTOLOGY - PAP
Chlamydia: NEGATIVE
Comment: NEGATIVE
Comment: NEGATIVE
Comment: NORMAL
Diagnosis: NEGATIVE
High risk HPV: NEGATIVE
Neisseria Gonorrhea: NEGATIVE

## 2022-03-27 NOTE — Progress Notes (Signed)
Normal cervical cells and negative for HPV. Next pap 3 years.  Neg for STD.

## 2022-04-08 ENCOUNTER — Ambulatory Visit (INDEPENDENT_AMBULATORY_CARE_PROVIDER_SITE_OTHER): Payer: Managed Care, Other (non HMO) | Admitting: Family Medicine

## 2022-04-08 ENCOUNTER — Encounter: Payer: Managed Care, Other (non HMO) | Admitting: Physician Assistant

## 2022-04-08 ENCOUNTER — Other Ambulatory Visit: Payer: Self-pay | Admitting: Physician Assistant

## 2022-04-08 ENCOUNTER — Encounter: Payer: Self-pay | Admitting: Family Medicine

## 2022-04-08 VITALS — BP 120/78 | HR 83 | Temp 98.8°F | Ht 64.0 in | Wt 146.9 lb

## 2022-04-08 DIAGNOSIS — F900 Attention-deficit hyperactivity disorder, predominantly inattentive type: Secondary | ICD-10-CM

## 2022-04-08 DIAGNOSIS — R051 Acute cough: Secondary | ICD-10-CM | POA: Insufficient documentation

## 2022-04-08 MED ORDER — PROMETHAZINE-DM 6.25-15 MG/5ML PO SYRP: 5.0000 mL | ORAL_SOLUTION | Freq: Every evening | ORAL | 0 refills | Status: DC | PRN

## 2022-04-08 MED ORDER — PREDNISONE 5 MG PO TABS: 5.0000 mg | ORAL_TABLET | Freq: Two times a day (BID) | ORAL | 0 refills | Status: DC

## 2022-04-08 NOTE — Telephone Encounter (Signed)
Patient dropped RX assistance forms to be completed and faxed. Forms placed in Jade's folder.

## 2022-04-08 NOTE — Telephone Encounter (Signed)
FYI

## 2022-04-08 NOTE — Patient Instructions (Addendum)
Prednisone warning: Prednisone is an anti-inflammatory medication. It is best to only use it for short periods of time. It can cause  your blood pressure to increase and it can cause your blood sugar to increase. If you are a diabetic, please check your blood sugar twice per day while you are taking prednisone. If your sugars are > 200 or < 70 please seek medical attention right away.    Elevating to sleep Humification Honey is also good.

## 2022-04-08 NOTE — Progress Notes (Signed)
Acute Office Visit  Subjective:     Patient ID: Sherry Yang, female    DOB: 11/20/2000, 22 y.o.   MRN: [id number]  Chief Complaint  Patient presents with   Cough    Family exposure to URI a couple of weeks ago; She says that she has had body aches. Dry She has taken OTC muccinex, Promethazine and Delsum.    Nasal Congestion    States that it is in her chest. It worsens when laying down. She is not sleeping well.     HPI Presents today for an acute visit with complaint of having a viral illness 2 weeks ago, continues to have congestion,  uncontrolled cough, productive at times. Affecting her sleep.  Symptoms have been present for 3 weeks.  Associated symptoms include: none Pertinent negatives: no fever or chills, no pain with deep breathing, no nausea or vomiting. No shortness of breath.  Pain severity: 0/10 Treatments tried include : mucinex, promethazine DM x 1,  and delsum Treatment effective : not controlling cough at night, day time is not as bad Sick contacts : 2 weeks ago   Review of Systems  Constitutional:  Negative for chills and fever.  HENT:  Positive for congestion.   Respiratory:  Positive for cough and sputum production. Negative for shortness of breath.   Cardiovascular:  Negative for chest pain.  Gastrointestinal:  Negative for nausea and vomiting.        Objective:    BP 120/78   Pulse 83   Temp 98.8 F (37.1 C) (Oral)   Ht 5\' 4"  (1.626 m)   Wt 146 lb 14.4 oz (66.6 kg)   SpO2 97%   BMI 25.22 kg/m    Physical Exam Vitals and nursing note reviewed.  Constitutional:      General: She is not in acute distress.    Appearance: Normal appearance.  HENT:     Right Ear: Tympanic membrane normal.     Left Ear: Tympanic membrane normal.     Nose: Nose normal.     Right Turbinates: Swollen.     Left Turbinates: Swollen.     Right Sinus: No maxillary sinus tenderness or frontal sinus tenderness.     Left Sinus: No maxillary sinus tenderness or  frontal sinus tenderness.     Mouth/Throat:     Mouth: Mucous membranes are moist.     Pharynx: Oropharynx is clear. No oropharyngeal exudate, posterior oropharyngeal erythema or uvula swelling.  Cardiovascular:     Rate and Rhythm: Normal rate and regular rhythm.     Heart sounds: Normal heart sounds.  Pulmonary:     Effort: Pulmonary effort is normal.     Breath sounds: Normal breath sounds.  Skin:    General: Skin is warm and dry.  Neurological:     General: No focal deficit present.     Mental Status: She is alert. Mental status is at baseline.  Psychiatric:        Mood and Affect: Mood normal.        Behavior: Behavior normal.        Thought Content: Thought content normal.        Judgment: Judgment normal.     No results found for any visits on 04/08/22.      Assessment & Plan:   Problem List Items Addressed This Visit     Acute cough - Primary   Relevant Medications   predniSONE (DELTASONE) 5 MG tablet   promethazine-dextromethorphan (PROMETHAZINE-DM)  6.25-15 MG/5ML syrup    Meds ordered this encounter  Medications   predniSONE (DELTASONE) 5 MG tablet    Sig: Take 1 tablet (5 mg total) by mouth 2 (two) times daily with a meal.    Dispense:  10 tablet    Refill:  0    Order Specific Question:   Supervising Provider    Answer:   Leeanne Rio [[id number]]   promethazine-dextromethorphan (PROMETHAZINE-DM) 6.25-15 MG/5ML syrup    Sig: Take 5 mLs by mouth at bedtime as needed for cough.    Dispense:  118 mL    Refill:  0    Order Specific Question:   Supervising Provider    Answer:   Leeanne Rio [[id number]]  Recent viral illness with lingering cough. Lungs clear, oxygen saturation 97%. Cough worse at night while lying down to sleep. No pain with inspiration, feels tired due to lack of sleep.  Prednisone 5 mg BID for 5 days to decrease inflammation. Promethazine 6.25-15 mg as needed for bedtime. Elevating to sleep, humidification, and honey may also  help. Will defer chest x-ray today to see if treatment improves symptoms.  If her symptoms do not improve, she will follow-up with PCP or return here for possible chest x-ray. Agrees with plan of care discussed today. Questions answered.  Return if symptoms worsen or fail to improve.  Chalmers Guest, FNP

## 2022-04-09 MED ORDER — VYVANSE 40 MG PO CAPS: 40.0000 mg | ORAL_CAPSULE | ORAL | 0 refills | Status: DC

## 2022-04-09 NOTE — Telephone Encounter (Signed)
Patient assistance forms faxed to [employer] at [phone] with confirmation received.

## 2022-04-09 NOTE — Telephone Encounter (Signed)
Paperwork requires printed RX but scheduled so it won't let me print. Please sign.

## 2022-04-11 ENCOUNTER — Other Ambulatory Visit: Payer: Self-pay | Admitting: Physician Assistant

## 2022-04-11 DIAGNOSIS — F419 Anxiety disorder, unspecified: Secondary | ICD-10-CM

## 2022-04-16 ENCOUNTER — Encounter: Payer: Self-pay | Admitting: Physician Assistant

## 2022-04-16 DIAGNOSIS — F900 Attention-deficit hyperactivity disorder, predominantly inattentive type: Secondary | ICD-10-CM

## 2022-04-16 MED ORDER — LISDEXAMFETAMINE DIMESYLATE 40 MG PO CAPS: 40.0000 mg | ORAL_CAPSULE | ORAL | 0 refills | Status: DC

## 2022-04-16 NOTE — Telephone Encounter (Signed)
Per patient request generic vyvanse sent to pharmacy.

## 2022-05-06 ENCOUNTER — Telehealth (INDEPENDENT_AMBULATORY_CARE_PROVIDER_SITE_OTHER): Payer: Managed Care, Other (non HMO) | Admitting: Physician Assistant

## 2022-05-06 ENCOUNTER — Encounter: Payer: Self-pay | Admitting: Physician Assistant

## 2022-05-06 VITALS — Wt 143.0 lb

## 2022-05-06 DIAGNOSIS — F900 Attention-deficit hyperactivity disorder, predominantly inattentive type: Secondary | ICD-10-CM | POA: Diagnosis not present

## 2022-05-06 MED ORDER — METHYLPHENIDATE HCL ER (OSM) 27 MG PO TBCR: 27.0000 mg | EXTENDED_RELEASE_TABLET | ORAL | 0 refills | Status: DC

## 2022-05-06 NOTE — Progress Notes (Signed)
..  Virtual Visit via Video Note  I connected with Sherry Yang on 05/06/22 at  9:10 AM EST by a video enabled telemedicine application and verified that I am speaking with the correct person using two identifiers.  Location: Patient: home Provider: clinic  .Marland KitchenParticipating in visit:  Patient: Sherry Yang Provider: Iran Planas PA-C   I discussed the limitations of evaluation and management by telemedicine and the availability of in person appointments. The patient expressed understanding and agreed to proceed.  History of Present Illness: Pt is a 22 yo female with ADHD she is not able to get her vyvanse and wants her medication switched. She is in college. She did not have good results with adderall. Overall right now anxiety and depression are well controlled.   .. Active Ambulatory Problems    Diagnosis Date Noted   Gastritis without bleeding 08/16/2015   Duodenitis 07/05/2015   IBD (inflammatory bowel disease) 03/30/2014   Irregular menses 11/26/2014   Moderate episode of recurrent major depressive disorder ([place]) 11/26/2014   Attention deficit hyperactivity disorder (ADHD), predominantly inattentive type 11/20/2016   Chronic midline thoracic back pain 11/20/2016   History of childhood obesity 11/20/2016   History of syncope 03/07/2017   Postural dizziness with near syncope 03/07/2017   Acne vulgaris 03/07/2017   Need for HPV vaccination 06/25/2017   Axillary adenopathy 11/30/2018   Anxiety 02/03/2019   GAD (generalized anxiety disorder) 09/21/2019   Depression, recurrent ([place]) 09/21/2019   Trouble in sleeping 09/21/2019   Tonsillith 08/09/2020   Birth control counseling 08/09/2020   Nodule of skin of left lower leg 08/09/2020   Vulvar irritation 11/15/2020   COVID-19 virus infection 11/28/2020   Family history of restless legs syndrome 01/31/2021   RLS (restless legs syndrome) 01/31/2021   Family history of Parkinson disease 01/31/2021   Stress 06/19/2021   Blast injury  of right hand 10/15/2021   Acute cough 04/08/2022   Resolved Ambulatory Problems    Diagnosis Date Noted   Esophagitis 07/05/2015   Obesity 08/16/2015   Encounter for medication management in attention deficit hyperactivity disorder (ADHD) 03/04/2017   Past Medical History:  Diagnosis Date   ADHD    Chronic gastritis    Depression    Duodenitis determined by biopsy    Inflammatory bowel disease    Obesity, pediatric    Snoring        Observations/Objective: No acute distress Normal mood and appearance  .Marland Kitchen Today's Vitals   05/06/22 0849  Weight: 143 lb (64.9 kg)   Body mass index is 24.55 kg/m.     Assessment and Plan: Marland KitchenMarland KitchenJazmon was seen today for medication refill.  Diagnoses and all orders for this visit:  Attention deficit hyperactivity disorder (ADHD), predominantly inattentive type -     methylphenidate 27 MG PO CR tablet; Take 1 tablet (27 mg total) by mouth every morning.   Switched to [employer].  If not problems will send refills without appt for the next 6 months.     Follow Up Instructions:    I discussed the assessment and treatment plan with the patient. The patient was provided an opportunity to ask questions and all were answered. The patient agreed with the plan and demonstrated an understanding of the instructions.   The patient was advised to call back or seek an in-person evaluation if the symptoms worsen or if the condition fails to improve as anticipated.   Iran Planas, PA-C

## 2022-05-09 ENCOUNTER — Encounter: Payer: Self-pay | Admitting: Physician Assistant

## 2022-05-10 MED ORDER — AMPHETAMINE-DEXTROAMPHETAMINE 10 MG PO TABS: 10.0000 mg | ORAL_TABLET | Freq: Two times a day (BID) | ORAL | 0 refills | Status: DC

## 2022-05-10 NOTE — Telephone Encounter (Signed)
Stop concerta because not working and start adderall immediate release bid 81m.

## 2022-06-10 ENCOUNTER — Encounter: Payer: Self-pay | Admitting: Physician Assistant

## 2022-06-11 MED ORDER — AMPHETAMINE-DEXTROAMPHETAMINE 10 MG PO TABS: 10.0000 mg | ORAL_TABLET | Freq: Two times a day (BID) | ORAL | 0 refills | Status: DC

## 2022-06-11 NOTE — Telephone Encounter (Signed)
Patient requesting rx rf of Adderall '10mg'$   Last written 05/10/2022 Last OV 05/06/2022 No upcoming appts schld.

## 2022-07-17 ENCOUNTER — Encounter: Payer: Self-pay | Admitting: Physician Assistant

## 2022-07-17 MED ORDER — LISDEXAMFETAMINE DIMESYLATE 30 MG PO CHEW: 30.0000 mg | CHEWABLE_TABLET | Freq: Every day | ORAL | 0 refills | Status: DC

## 2022-08-15 ENCOUNTER — Encounter: Payer: Self-pay | Admitting: Physician Assistant

## 2022-08-15 DIAGNOSIS — Z3009 Encounter for other general counseling and advice on contraception: Secondary | ICD-10-CM

## 2022-08-15 DIAGNOSIS — L700 Acne vulgaris: Secondary | ICD-10-CM

## 2022-08-16 MED ORDER — LISDEXAMFETAMINE DIMESYLATE 30 MG PO CHEW: 30.0000 mg | CHEWABLE_TABLET | Freq: Every day | ORAL | 0 refills | Status: DC

## 2022-08-16 MED ORDER — NORGESTIM-ETH ESTRAD TRIPHASIC 0.18/0.215/0.25 MG-25 MCG PO TABS: 1.0000 | ORAL_TABLET | Freq: Every day | ORAL | 2 refills | Status: DC

## 2022-09-17 ENCOUNTER — Encounter: Payer: Self-pay | Admitting: Physician Assistant

## 2022-09-17 MED ORDER — LISDEXAMFETAMINE DIMESYLATE 30 MG PO CHEW: 30.0000 mg | CHEWABLE_TABLET | Freq: Every day | ORAL | 0 refills | Status: DC

## 2022-10-16 ENCOUNTER — Encounter: Payer: Self-pay | Admitting: Physician Assistant

## 2022-11-18 ENCOUNTER — Encounter: Payer: Self-pay | Admitting: Physician Assistant

## 2022-11-18 MED ORDER — LISDEXAMFETAMINE DIMESYLATE 30 MG PO CHEW: 30.0000 mg | CHEWABLE_TABLET | Freq: Every day | ORAL | 0 refills | Status: DC

## 2023-02-19 ENCOUNTER — Telehealth: Payer: Self-pay | Admitting: Physician Assistant

## 2023-02-19 DIAGNOSIS — Z131 Encounter for screening for diabetes mellitus: Secondary | ICD-10-CM

## 2023-02-19 DIAGNOSIS — Z1329 Encounter for screening for other suspected endocrine disorder: Secondary | ICD-10-CM

## 2023-02-19 DIAGNOSIS — Z13220 Encounter for screening for lipoid disorders: Secondary | ICD-10-CM

## 2023-02-19 DIAGNOSIS — Z0000 Encounter for general adult medical examination without abnormal findings: Secondary | ICD-10-CM

## 2023-02-19 NOTE — Telephone Encounter (Signed)
Basic lipid, cmp, TSH, cbc ordered to come have drawn when fasting for 8 hours.

## 2023-02-19 NOTE — Telephone Encounter (Signed)
Patient is scheduled for a physical on 02-25-23 she is requesting labs prior to her appt

## 2023-02-22 LAB — CBC WITH DIFFERENTIAL/PLATELET
Basophils Absolute: 0 10*3/uL (ref 0.0–0.2)
Basos: 1 %
EOS (ABSOLUTE): 0.1 10*3/uL (ref 0.0–0.4)
Eos: 1 %
Hematocrit: 39.2 % (ref 34.0–46.6)
Hemoglobin: 13.1 g/dL (ref 11.1–15.9)
Immature Grans (Abs): 0 10*3/uL (ref 0.0–0.1)
Immature Granulocytes: 0 %
Lymphocytes Absolute: 2.1 10*3/uL (ref 0.7–3.1)
Lymphs: 32 %
MCH: 31.6 pg (ref 26.6–33.0)
MCHC: 33.4 g/dL (ref 31.5–35.7)
MCV: 95 fL (ref 79–97)
Monocytes Absolute: 0.5 10*3/uL (ref 0.1–0.9)
Monocytes: 8 %
Neutrophils Absolute: 3.9 10*3/uL (ref 1.4–7.0)
Neutrophils: 58 %
Platelets: 316 10*3/uL (ref 150–450)
RBC: 4.15 x10E6/uL (ref 3.77–5.28)
RDW: 12.4 % (ref 11.7–15.4)
WBC: 6.7 10*3/uL (ref 3.4–10.8)

## 2023-02-22 LAB — CMP14+EGFR
ALT: 19 [IU]/L (ref 0–32)
AST: 25 [IU]/L (ref 0–40)
Albumin: 4.3 g/dL (ref 4.0–5.0)
Alkaline Phosphatase: 60 [IU]/L (ref 44–121)
BUN/Creatinine Ratio: 22 (ref 9–23)
BUN: 15 mg/dL (ref 6–20)
Bilirubin Total: 0.5 mg/dL (ref 0.0–1.2)
CO2: 22 mmol/L (ref 20–29)
Calcium: 9.7 mg/dL (ref 8.7–10.2)
Chloride: 103 mmol/L (ref 96–106)
Creatinine, Ser: 0.69 mg/dL (ref 0.57–1.00)
Globulin, Total: 2.5 g/dL (ref 1.5–4.5)
Glucose: 82 mg/dL (ref 70–99)
Potassium: 4 mmol/L (ref 3.5–5.2)
Sodium: 138 mmol/L (ref 134–144)
Total Protein: 6.8 g/dL (ref 6.0–8.5)
eGFR: 125 mL/min/{1.73_m2} (ref >59)

## 2023-02-22 LAB — TSH: TSH: 1.35 u[IU]/mL (ref 0.450–4.500)

## 2023-02-22 LAB — LIPID PANEL
Chol/HDL Ratio: 2.9 ratio (ref 0.0–4.4)
Cholesterol, Total: 205 mg/dL — ABNORMAL HIGH (ref 100–199)
HDL: 71 mg/dL (ref >39)
LDL Chol Calc (NIH): 121 mg/dL — ABNORMAL HIGH (ref 0–99)
Triglycerides: 72 mg/dL (ref 0–149)
VLDL Cholesterol Cal: 13 mg/dL (ref 5–40)

## 2023-02-24 NOTE — Progress Notes (Signed)
Will discuss at office visit. No concerns.

## 2023-02-25 ENCOUNTER — Encounter: Payer: Self-pay | Admitting: Physician Assistant

## 2023-02-25 ENCOUNTER — Ambulatory Visit (INDEPENDENT_AMBULATORY_CARE_PROVIDER_SITE_OTHER): Payer: Managed Care, Other (non HMO) | Admitting: Physician Assistant

## 2023-02-25 VITALS — BP 101/64 | HR 80 | Temp 97.6°F | Resp 20 | Ht 64.0 in | Wt 146.8 lb

## 2023-02-25 DIAGNOSIS — G2581 Restless legs syndrome: Secondary | ICD-10-CM

## 2023-02-25 DIAGNOSIS — G479 Sleep disorder, unspecified: Secondary | ICD-10-CM | POA: Diagnosis not present

## 2023-02-25 DIAGNOSIS — Z0000 Encounter for general adult medical examination without abnormal findings: Secondary | ICD-10-CM | POA: Diagnosis not present

## 2023-02-25 DIAGNOSIS — Z23 Encounter for immunization: Secondary | ICD-10-CM | POA: Diagnosis not present

## 2023-02-25 DIAGNOSIS — Z3009 Encounter for other general counseling and advice on contraception: Secondary | ICD-10-CM | POA: Diagnosis not present

## 2023-02-25 DIAGNOSIS — L700 Acne vulgaris: Secondary | ICD-10-CM | POA: Diagnosis not present

## 2023-02-25 DIAGNOSIS — F411 Generalized anxiety disorder: Secondary | ICD-10-CM

## 2023-02-25 DIAGNOSIS — F900 Attention-deficit hyperactivity disorder, predominantly inattentive type: Secondary | ICD-10-CM

## 2023-02-25 MED ORDER — ROPINIROLE HCL 0.25 MG PO TABS
ORAL_TABLET | ORAL | 1 refills | Status: AC
Start: 2023-02-25 — End: ?

## 2023-02-25 MED ORDER — LISDEXAMFETAMINE DIMESYLATE 30 MG PO CHEW: 30.0000 mg | CHEWABLE_TABLET | Freq: Every day | ORAL | 0 refills | Status: DC

## 2023-02-25 MED ORDER — NORGESTIM-ETH ESTRAD TRIPHASIC 0.18/0.215/0.25 MG-25 MCG PO TABS
1.0000 | ORAL_TABLET | Freq: Every day | ORAL | 4 refills | Status: AC
Start: 2023-02-25 — End: ?

## 2023-02-25 MED ORDER — LISDEXAMFETAMINE DIMESYLATE 30 MG PO CHEW
30.0000 mg | CHEWABLE_TABLET | Freq: Every day | ORAL | 0 refills | Status: DC
Start: 2023-03-27 — End: 2023-05-07

## 2023-02-25 NOTE — Patient Instructions (Signed)
For sleep:Calm Aid/Unisom/Valarian root  For anxiety: Ashwaganda/Calm Aid/EXERCISE  Health Maintenance, Female Adopting a healthy lifestyle and getting preventive care are important in promoting health and wellness. Ask your health care provider about: The right schedule for you to have regular tests and exams. Things you can do on your own to prevent diseases and keep yourself healthy. What should I know about diet, weight, and exercise? Eat a healthy diet  Eat a diet that includes plenty of vegetables, fruits, low-fat dairy products, and lean protein. Do not eat a lot of foods that are high in solid fats, added sugars, or sodium. Maintain a healthy weight Body mass index (BMI) is used to identify weight problems. It estimates body fat based on height and weight. Your health care provider can help determine your BMI and help you achieve or maintain a healthy weight. Get regular exercise Get regular exercise. This is one of the most important things you can do for your health. Most adults should: Exercise for at least 150 minutes each week. The exercise should increase your heart rate and make you sweat (moderate-intensity exercise). Do strengthening exercises at least twice a week. This is in addition to the moderate-intensity exercise. Spend less time sitting. Even light physical activity can be beneficial. Watch cholesterol and blood lipids Have your blood tested for lipids and cholesterol at 23 years of age, then have this test every 5 years. Have your cholesterol levels checked more often if: Your lipid or cholesterol levels are high. You are older than 23 years of age. You are at high risk for heart disease. What should I know about cancer screening? Depending on your health history and family history, you may need to have cancer screening at various ages. This may include screening for: Breast cancer. Cervical cancer. Colorectal cancer. Skin cancer. Lung cancer. What should I  know about heart disease, diabetes, and high blood pressure? Blood pressure and heart disease High blood pressure causes heart disease and increases the risk of stroke. This is more likely to develop in people who have high blood pressure readings or are overweight. Have your blood pressure checked: Every 3-5 years if you are 30-83 years of age. Every year if you are 85 years old or older. Diabetes Have regular diabetes screenings. This checks your fasting blood sugar level. Have the screening done: Once every three years after age 67 if you are at a normal weight and have a low risk for diabetes. More often and at a younger age if you are overweight or have a high risk for diabetes. What should I know about preventing infection? Hepatitis B If you have a higher risk for hepatitis B, you should be screened for this virus. Talk with your health care provider to find out if you are at risk for hepatitis B infection. Hepatitis C Testing is recommended for: Everyone born from [birth year] through 1966. Anyone with known risk factors for hepatitis C. Sexually transmitted infections (STIs) Get screened for STIs, including gonorrhea and chlamydia, if: You are sexually active and are younger than 23 years of age. You are older than 23 years of age and your health care provider tells you that you are at risk for this type of infection. Your sexual activity has changed since you were last screened, and you are at increased risk for chlamydia or gonorrhea. Ask your health care provider if you are at risk. Ask your health care provider about whether you are at high risk for HIV. Your health care  provider may recommend a prescription medicine to help prevent HIV infection. If you choose to take medicine to prevent HIV, you should first get tested for HIV. You should then be tested every 3 months for as long as you are taking the medicine. Pregnancy If you are about to stop having your period (premenopausal) and  you may become pregnant, seek counseling before you get pregnant. Take 400 to 800 micrograms (mcg) of folic acid every day if you become pregnant. Ask for birth control (contraception) if you want to prevent pregnancy. Osteoporosis and menopause Osteoporosis is a disease in which the bones lose minerals and strength with aging. This can result in bone fractures. If you are 21 years old or older, or if you are at risk for osteoporosis and fractures, ask your health care provider if you should: Be screened for bone loss. Take a calcium or vitamin D supplement to lower your risk of fractures. Be given hormone replacement therapy (HRT) to treat symptoms of menopause. Follow these instructions at home: Alcohol use Do not drink alcohol if: Your health care provider tells you not to drink. You are pregnant, may be pregnant, or are planning to become pregnant. If you drink alcohol: Limit how much you have to: 0-1 drink a day. Know how much alcohol is in your drink. In the [place] one drink equals one 12 oz bottle of beer (355 mL), one 5 oz glass of wine (148 mL), or one 1 oz glass of hard liquor (44 mL). Lifestyle Do not use any products that contain nicotine or tobacco. These products include cigarettes, chewing tobacco, and vaping devices, such as e-cigarettes. If you need help quitting, ask your health care provider. Do not use street drugs. Do not share needles. Ask your health care provider for help if you need support or information about quitting drugs. General instructions Schedule regular health, dental, and eye exams. Stay current with your vaccines. Tell your health care provider if: You often feel depressed. You have ever been abused or do not feel safe at home. Summary Adopting a healthy lifestyle and getting preventive care are important in promoting health and wellness. Follow your health care provider's instructions about healthy diet, exercising, and getting tested or screened  for diseases. Follow your health care provider's instructions on monitoring your cholesterol and blood pressure. This information is not intended to replace advice given to you by your health care provider. Make sure you discuss any questions you have with your health care provider. Document Revised: 08/07/2020 Document Reviewed: 08/07/2020 Elsevier Patient Education  2024 [employer].

## 2023-02-26 NOTE — Progress Notes (Signed)
Complete physical exam  Patient: Sherry Yang   DOB: 24-Dec-2000   22 y.o. Female  MRN: [id number]  Subjective:    Chief Complaint  Patient presents with   Annual Exam    Sherry Yang is a 22 y.o. female who presents today for a complete physical exam. She reports consuming a general diet. The patient has a physically strenuous job, but has no regular exercise apart from work.  She generally feels fairly well. She reports sleeping poorly. She does not have additional problems to discuss today.    Most recent fall risk assessment:    02/25/2023    2:10 PM  Fall Risk   Falls in the past year? 0  Number falls in past yr: 0  Injury with Fall? 0  Risk for fall due to : No Fall Risks  Follow up Falls evaluation completed     Most recent depression screenings:    02/25/2023    2:10 PM 03/19/2022    2:34 PM  PHQ 2/9 Scores  PHQ - 2 Score 0 3  PHQ- 9 Score 6 6    Vision:Within last year, Dental: No current dental problems and Receives regular dental care, and STD: The patient denies history of sexually transmitted disease.  Patient Active Problem List   Diagnosis Date Noted   Acute cough 04/08/2022   Blast injury of right hand 10/15/2021   Stress 06/19/2021   Family history of restless legs syndrome 01/31/2021   RLS (restless legs syndrome) 01/31/2021   Family history of Parkinson disease 01/31/2021   COVID-19 virus infection 11/28/2020   Vulvar irritation 11/15/2020   Tonsillith 08/09/2020   Birth control counseling 08/09/2020   Nodule of skin of left lower leg 08/09/2020   GAD (generalized anxiety disorder) 09/21/2019   Depression, recurrent ([facility]) 09/21/2019   Trouble in sleeping 09/21/2019   Anxiety 02/03/2019   Axillary adenopathy 11/30/2018   Need for HPV vaccination 06/25/2017   History of syncope 03/07/2017   Postural dizziness with near syncope 03/07/2017   Acne vulgaris 03/07/2017   Attention deficit hyperactivity disorder (ADHD), predominantly  inattentive type 11/20/2016   Chronic midline thoracic back pain 11/20/2016   History of childhood obesity 11/20/2016   Gastritis without bleeding 08/16/2015   Duodenitis 07/05/2015   Irregular menses 11/26/2014   Moderate episode of recurrent major depressive disorder ([facility]) 11/26/2014   IBD (inflammatory bowel disease) 03/30/2014   Past Medical History:  Diagnosis Date   ADHD    Anxiety    Chronic gastritis    Depression    Duodenitis determined by biopsy    Inflammatory bowel disease    Irregular menses    Obesity, pediatric    Snoring    Past Surgical History:  Procedure Laterality Date   COLONOSCOPY WITH ESOPHAGOGASTRODUODENOSCOPY (EGD)  05/2015   Family History  Problem Relation Age of Onset   Hypertension Father    Irritable bowel syndrome Father    Colon polyps Mother    Allergies  Allergen Reactions   [employer] consultant Hcl]     RLS      Patient Care Team: Nolene Ebbs as PCP - General (Family Medicine)   Outpatient Medications Prior to Visit  Medication Sig   clonazePAM (KLONOPIN) 1 MG tablet Take 1 tablet (1 mg total) by mouth 2 (two) times daily as needed for anxiety.   ibuprofen (ADVIL) 800 MG tablet Take 1 tablet (800 mg total) by mouth every 8 (eight) hours as needed.  propranolol (INDERAL) 10 MG tablet TAKE 1-2 TABLETS BY MOUTH UP TO THREE TIME   [DISCONTINUED] lisdexamfetamine (VYVANSE) 30 MG chewable tablet Chew 1 tablet (30 mg total) by mouth daily.   [DISCONTINUED] lisdexamfetamine (VYVANSE) 30 MG chewable tablet Chew 1 tablet (30 mg total) by mouth daily.   [DISCONTINUED] lisdexamfetamine (VYVANSE) 30 MG chewable tablet Chew 1 tablet (30 mg total) by mouth daily.   [DISCONTINUED] Norgestimate-Ethinyl Estradiol Triphasic (TRI-LO-MARZIA) 0.18/0.215/0.25 MG-25 MCG tab Take 1 tablet by mouth daily.   [DISCONTINUED] rOPINIRole (REQUIP) 0.25 MG tablet TAKE 1 TO 3 TABLETS BY MOUTH AT BEDTIME FOR RESTLESS LEG.   [DISCONTINUED]  amphetamine-dextroamphetamine (ADDERALL) 10 MG tablet Take 1 tablet (10 mg total) by mouth 2 (two) times daily.   [DISCONTINUED] amphetamine-dextroamphetamine (ADDERALL) 10 MG tablet Take 1 tablet (10 mg total) by mouth 2 (two) times daily.   [DISCONTINUED] predniSONE (DELTASONE) 5 MG tablet Take 1 tablet (5 mg total) by mouth 2 (two) times daily with a meal.   [DISCONTINUED] promethazine-dextromethorphan (PROMETHAZINE-DM) 6.25-15 MG/5ML syrup Take 5 mLs by mouth at bedtime as needed for cough.   No facility-administered medications prior to visit.    ROS        Objective:     BP 101/64   Pulse 80   Temp 97.6 F (36.4 C)   Resp 20   Ht 5\' 4"  (1.626 m)   Wt 146 lb 12.8 oz (66.6 kg)   SpO2 99%   BMI 25.20 kg/m  BP Readings from Last 3 Encounters:  02/25/23 101/64  04/08/22 120/78  03/26/22 105/69   Wt Readings from Last 3 Encounters:  02/25/23 146 lb 12.8 oz (66.6 kg)  05/06/22 143 lb (64.9 kg)  04/08/22 146 lb 14.4 oz (66.6 kg)  ..    02/25/2023    2:11 PM 03/19/2022    2:36 PM 11/23/2021    2:00 PM 10/15/2021    2:23 PM  GAD 7 : Generalized Anxiety Score  Nervous, Anxious, on Edge 3 2 3 3   Control/stop worrying 3 2 3 3   Worry too much - different things 3 2 3 3   Trouble relaxing 2 1 3 2   Restless 1 0 2 1  Easily annoyed or irritable 0 1 2 2   Afraid - awful might happen 0 0 1 1  Total GAD 7 Score 12 8 17 15   Anxiety Difficulty Very difficult Somewhat difficult Somewhat difficult Very difficult        Physical Exam  BP 101/64   Pulse 80   Temp 97.6 F (36.4 C)   Resp 20   Ht 5\' 4"  (1.626 m)   Wt 146 lb 12.8 oz (66.6 kg)   SpO2 99%   BMI 25.20 kg/m   General Appearance:    Alert, cooperative, no distress, appears stated age  Head:    Normocephalic, without obvious abnormality, atraumatic  Eyes:    PERRL, conjunctiva/corneas clear, EOM's intact, fundi    benign, both eyes  Ears:    Normal TM's and external ear canals, both ears  Nose:   Nares  normal, septum midline, mucosa normal, no drainage    or sinus tenderness  Throat:   Lips, mucosa, and tongue normal; teeth and gums normal  Neck:   Supple, symmetrical, trachea midline, no adenopathy;    thyroid:  no enlargement/tenderness/nodules; no carotid   bruit or JVD  Back:     Symmetric, no curvature, ROM normal, no CVA tenderness  Lungs:     Clear to auscultation  bilaterally, respirations unlabored  Chest Wall:    No tenderness or deformity   Heart:    Regular rate and rhythm, S1 and S2 normal, no murmur, rub   or gallop     Abdomen:     Soft, non-tender, bowel sounds active all four quadrants,    no masses, no organomegaly        Extremities:   Extremities normal, atraumatic, no cyanosis or edema  Pulses:   2+ and symmetric all extremities  Skin:   Skin color, texture, turgor normal, no rashes or lesions  Lymph nodes:   Cervical, supraclavicular, and axillary nodes normal  Neurologic:   CNII-XII intact, normal strength, sensation and reflexes    throughout      Assessment & Plan:    Routine Health Maintenance and Physical Exam  Immunization History  Administered Date(s) Administered   DTaP 11/21/1999, 01/23/2000, 03/27/2000, 07/31/2001, 05/24/2004   HIB (PRP-OMP) 11/21/1999, 01/23/2000, 03/27/2000, 10/07/2000   HIB (PRP-T) 11/21/1999, 01/23/2000, 03/27/2000, 10/07/2000   HIB, Unspecified 11/21/1999, 01/23/2000, 03/27/2000, 10/07/2000   HPV 9-valent 06/25/2017   Hepatitis B, PED/ADOLESCENT Mar 14, 2000, 11/21/1999, 06/20/2000   IPV 11/21/1999, 01/23/2000, 07/31/2001, 05/24/2004   Influenza, Seasonal, Injecte, Preservative Fre 02/25/2023   Influenza,inj,Quad PF,6+ Mos 12/23/2019   Influenza-Unspecified 12/23/2019, 11/13/2020, 11/26/2021   MMR 10/07/2000, 05/24/2004   Meningococcal B Recombinant 12/12/2000   Meningococcal Conjugate 12/12/2000, 12/13/2010   Meningococcal Mcv4o 06/25/2017   PFIZER Comirnaty(Gray Top)Covid-19 Tri-Sucrose Vaccine 01/28/2020    PFIZER(Purple Top)SARS-COV-2 Vaccination 01/28/2020, 02/18/2020   PPD Test 11/20/2016, 06/25/2017, 02/05/2021, 02/13/2021, 01/16/2022   Tdap 12/13/2010, 11/30/2020   Varicella 10/07/2000, 12/13/2010    Health Maintenance  Topic Date Due   COVID-19 Vaccine (4 - 2023-24 season) 03/13/2023 (Originally 12/01/2022)   HPV VACCINES (2 - 3-dose series) 02/25/2024 (Originally 07/23/2017)   Hepatitis C Screening  02/25/2024 (Originally 09/20/2017)   CHLAMYDIA SCREENING  03/27/2023   Cervical Cancer Screening (Pap smear)  03/26/2025   DTaP/Tdap/Td (8 - Td or Tdap) 12/01/2030   INFLUENZA VACCINE  Completed   HIV Screening  Completed    Discussed health benefits of physical activity, and encouraged her to engage in regular exercise appropriate for her age and condition. Marland KitchenFritzi Mandes was seen today for annual exam.  Diagnoses and all orders for this visit:  Routine physical examination  Need for influenza vaccination -     Flu vaccine trivalent PF, 6mos and older(Flulaval,Afluria,Fluarix,Fluzone)  Trouble in sleeping  Acne vulgaris -     Norgestim-Eth Estrad Triphasic (NORGESTIMATE-ETHINYL ESTRADIOL TRIPHASIC) 0.18/0.215/0.25 MG-25 MCG tab; Take 1 tablet by mouth daily.  Birth control counseling -     Norgestim-Eth Estrad Triphasic (NORGESTIMATE-ETHINYL ESTRADIOL TRIPHASIC) 0.18/0.215/0.25 MG-25 MCG tab; Take 1 tablet by mouth daily.  RLS (restless legs syndrome) -     rOPINIRole (REQUIP) 0.25 MG tablet; TAKE 1 TO 3 TABLETS BY MOUTH AT BEDTIME FOR RESTLESS LEG.  GAD (generalized anxiety disorder)  Attention deficit hyperactivity disorder (ADHD), predominantly inattentive type -     lisdexamfetamine (VYVANSE) 30 MG chewable tablet; Chew 1 tablet (30 mg total) by mouth daily. -     lisdexamfetamine (VYVANSE) 30 MG chewable tablet; Chew 1 tablet (30 mg total) by mouth daily. -     lisdexamfetamine (VYVANSE) 30 MG chewable tablet; Chew 1 tablet (30 mg total) by mouth daily.   .. Discussed 150  minutes of exercise a week.  Encouraged vitamin D 1000 units and Calcium 1300mg  or 4 servings of dairy a day.  Vitals look great Fasting labs reviewed today  Start B12 supplement for energy Work on sleep routine and trial of natural options PAP UTD Refilled OCP Vyvanse refilled for 3 months, follow up 6 months.  GAD not to goal Discussed natural ways to treat anxiety Declined covid vaccine, she has flu shot at work.   Return in about 6 months (around 08/25/2023).     Tandy Gaw, PA-C

## 2023-05-07 ENCOUNTER — Ambulatory Visit (INDEPENDENT_AMBULATORY_CARE_PROVIDER_SITE_OTHER): Payer: Managed Care, Other (non HMO) | Admitting: Physician Assistant

## 2023-05-07 VITALS — BP 120/73 | HR 91 | Ht 64.0 in | Wt 143.0 lb

## 2023-05-07 DIAGNOSIS — F900 Attention-deficit hyperactivity disorder, predominantly inattentive type: Secondary | ICD-10-CM

## 2023-05-07 DIAGNOSIS — R2242 Localized swelling, mass and lump, left lower limb: Secondary | ICD-10-CM

## 2023-05-07 DIAGNOSIS — S86899A Other injury of other muscle(s) and tendon(s) at lower leg level, unspecified leg, initial encounter: Secondary | ICD-10-CM | POA: Insufficient documentation

## 2023-05-07 MED ORDER — LISDEXAMFETAMINE DIMESYLATE 30 MG PO CHEW
30.0000 mg | CHEWABLE_TABLET | Freq: Every day | ORAL | 0 refills | Status: DC
Start: 2023-07-18 — End: 2023-11-25

## 2023-05-07 MED ORDER — LISDEXAMFETAMINE DIMESYLATE 30 MG PO CHEW
30.0000 mg | CHEWABLE_TABLET | Freq: Every day | ORAL | 0 refills | Status: DC
Start: 2023-06-18 — End: 2023-11-25

## 2023-05-07 MED ORDER — LISDEXAMFETAMINE DIMESYLATE 30 MG PO CHEW
30.0000 mg | CHEWABLE_TABLET | Freq: Every day | ORAL | 0 refills | Status: DC
Start: 2023-05-22 — End: 2023-10-16

## 2023-05-09 ENCOUNTER — Encounter: Payer: Self-pay | Admitting: Physician Assistant

## 2023-05-09 NOTE — Progress Notes (Signed)
 Established Patient Office Visit  Subjective   Patient ID: Sherry Yang, female    DOB: 03-Oct-2000  Age: 23 y.o. MRN: [id number]  Chief Complaint  Patient presents with   Medical Management of Chronic Issues    Unspecified bumps on legs pt denies any SOB, or discomfort     HPI Pt is a 23 yo female who has some lumps of her lower left anterior leg that come and go very close to her tibia. They are not painful. She has noticed them for many weeks and showed her mom. Stated she needed to get checked out.   She is doing well with vyvanse  and with focus and mood. Needs refills.   Active Ambulatory Problems    Diagnosis Date Noted   Gastritis without bleeding 08/16/2015   Duodenitis 07/05/2015   IBD (inflammatory bowel disease) 03/30/2014   Irregular menses 11/26/2014   Moderate episode of recurrent major depressive disorder (HCC) 11/26/2014   Attention deficit hyperactivity disorder (ADHD), predominantly inattentive type 11/20/2016   Chronic midline thoracic back pain 11/20/2016   History of childhood obesity 11/20/2016   History of syncope 03/07/2017   Postural dizziness with near syncope 03/07/2017   Acne vulgaris 03/07/2017   Need for HPV vaccination 06/25/2017   Axillary adenopathy 11/30/2018   Anxiety 02/03/2019   GAD (generalized anxiety disorder) 09/21/2019   Depression, recurrent (HCC) 09/21/2019   Trouble in sleeping 09/21/2019   Tonsillith 08/09/2020   Birth control counseling 08/09/2020   Nodule of skin of left lower leg 08/09/2020   Vulvar irritation 11/15/2020   COVID-19 virus infection 11/28/2020   Family history of restless legs syndrome 01/31/2021   RLS (restless legs syndrome) 01/31/2021   Family history of Parkinson disease 01/31/2021   Stress 06/19/2021   Blast injury of right hand 10/15/2021   Acute cough 04/08/2022   Anterior shin splints 05/07/2023   Resolved Ambulatory Problems    Diagnosis Date Noted   Esophagitis 07/05/2015   Obesity  08/16/2015   Encounter for medication management in attention deficit hyperactivity disorder (ADHD) 03/04/2017   Past Medical History:  Diagnosis Date   ADHD    Chronic gastritis    Depression    Duodenitis determined by biopsy    Inflammatory bowel disease    Obesity, pediatric    Snoring      Review of Systems  All other systems reviewed and are negative.     Objective:     BP 120/73   Pulse 91   Ht 5' 4 (1.626 m)   Wt 143 lb (64.9 kg)   SpO2 99%   BMI 24.55 kg/m  BP Readings from Last 3 Encounters:  05/07/23 120/73  02/25/23 101/64  04/08/22 120/78   Wt Readings from Last 3 Encounters:  05/07/23 143 lb (64.9 kg)  02/25/23 146 lb 12.8 oz (66.6 kg)  05/06/22 143 lb (64.9 kg)      Physical Exam Cardiovascular:     Rate and Rhythm: Normal rate.  Pulmonary:     Effort: Pulmonary effort is normal.  Musculoskeletal:     Right lower leg: No edema.     Left lower leg: No edema.     Comments: No bumps on left anterior leg seen today No tenderness to palpation No edema  Neurological:     General: No focal deficit present.     Mental Status: She is oriented to person, place, and time.  Psychiatric:        Mood and Affect: Mood  normal.         Assessment & Plan:  Sherry Yang was seen today for medical management of chronic issues.  Diagnoses and all orders for this visit:  Anterior shin splints  Attention deficit hyperactivity disorder (ADHD), predominantly inattentive type -     lisdexamfetamine (VYVANSE ) 30 MG chewable tablet; Chew 1 tablet (30 mg total) by mouth daily. -     lisdexamfetamine (VYVANSE ) 30 MG chewable tablet; Chew 1 tablet (30 mg total) by mouth daily. -     lisdexamfetamine (VYVANSE ) 30 MG chewable tablet; Chew 1 tablet (30 mg total) by mouth daily.   Bump via picture seems to be over medial tibial muscle No active bumps today With coming and going not likely lipoma Suspect more like muscle spasm or knot Consider massage,  stretching, heat and compression when occur Follow up as needed or if they change  Vyvanse  refilled.    Mikell Camp, PA-C

## 2023-05-26 ENCOUNTER — Encounter: Payer: Self-pay | Admitting: Family Medicine

## 2023-05-26 ENCOUNTER — Telehealth (INDEPENDENT_AMBULATORY_CARE_PROVIDER_SITE_OTHER): Payer: Managed Care, Other (non HMO) | Admitting: Family Medicine

## 2023-05-26 VITALS — Ht 64.0 in | Wt 142.0 lb

## 2023-05-26 DIAGNOSIS — H1032 Unspecified acute conjunctivitis, left eye: Secondary | ICD-10-CM

## 2023-05-26 DIAGNOSIS — H109 Unspecified conjunctivitis: Secondary | ICD-10-CM | POA: Insufficient documentation

## 2023-05-26 MED ORDER — POLYMYXIN B-TRIMETHOPRIM 10000-0.1 UNIT/ML-% OP SOLN: 1.0000 [drp] | OPHTHALMIC | 0 refills | Status: DC

## 2023-05-26 NOTE — Progress Notes (Signed)
 Sherry Yang - 23 y.o. female MRN [id number]  Date of birth: 2000-08-18   This visit type was conducted due to national recommendations for restrictions regarding the COVID-19 Pandemic (e.g. social distancing).  This format is felt to be most appropriate for this patient at this time.  All issues noted in this document were discussed and addressed.  No physical exam was performed (except for noted visual exam findings with Video Visits).  I discussed the limitations of evaluation and management by telemedicine and the availability of in person appointments. The patient expressed understanding and agreed to proceed.  I connected withNAME@ on 05/26/23 at  2:30 PM EST by a video enabled telemedicine application and verified that I am speaking with the correct person using two identifiers.  Present at visit: Everrett Coombe, DO [facility] [place]   Patient Location: Home [address] [place] [address]   Provider location:   [facility]  Chief Complaint  Patient presents with   Eye Problem    HPI  Sherry Yang is a 23 y.o. female who presents via audio/video conferencing for a telehealth visit today.  She reports 1 day history of eye irritation and pink coloration.  She has a small amount of clear discharge.  She has had mild nasal congestion.  She denies fever or chills.  She denies vision changes or eye pain.  She does not use contact lenses.  She works as and Charity fundraiser with pediatric oncology.    ROS:  A comprehensive ROS was completed and negative except as noted per HPI  Past Medical History:  Diagnosis Date   ADHD    Anxiety    Chronic gastritis    Depression    Duodenitis determined by biopsy    Inflammatory bowel disease    Irregular menses    Obesity, pediatric    Snoring     Past Surgical History:  Procedure Laterality Date   COLONOSCOPY WITH ESOPHAGOGASTRODUODENOSCOPY (EGD)  05/2015    Family History  Problem Relation Age of Onset   Hypertension Father     Irritable bowel syndrome Father    Colon polyps Mother     Social History   Socioeconomic History   Marital status: Single    Spouse name: Not on file   Number of children: Not on file   Years of education: Not on file   Highest education level: Not on file  Occupational History   Not on file  Tobacco Use   Smoking status: Never   Smokeless tobacco: Never  Vaping Use   Vaping status: Never Used  Substance and Sexual Activity   Alcohol use: No   Drug use: No   Sexual activity: Yes    Birth control/protection: Condom  Other Topics Concern   Not on file  Social History Narrative   Not on file   Social Drivers of Health   Financial Resource Strain: Not on file  Food Insecurity: No Food Insecurity (08/30/2020)   Received from [facility]   Hunger Vital Sign    Worried About Running Out of Food in the Last Year: Never true    Ran Out of Food in the Last Year: Never true  Transportation Needs: Not on file  Physical Activity: Not on file  Stress: Not on file  Social Connections: Unknown (08/05/2021)   Received from [facility]   Social Network    Social Network: Not on file  Intimate Partner Violence: Unknown (07/03/2021)  Received from [facility]   HITS    Physically Hurt: Not on file    Insult or Talk Down To: Not on file    Threaten Physical Harm: Not on file    Scream or Curse: Not on file     Current Outpatient Medications:    clonazePAM (KLONOPIN) 1 MG tablet, Take 1 tablet (1 mg total) by mouth 2 (two) times daily as needed for anxiety., Disp: 20 tablet, Rfl: 0   ibuprofen (ADVIL) 800 MG tablet, Take 1 tablet (800 mg total) by mouth every 8 (eight) hours as needed., Disp: 60 tablet, Rfl: 0   lisdexamfetamine (VYVANSE) 30 MG chewable tablet, Chew 1 tablet (30 mg total) by mouth daily., Disp: 30 tablet, Rfl: 0   [START ON 06/18/2023] lisdexamfetamine (VYVANSE) 30 MG chewable tablet, Chew 1 tablet (30 mg total) by  mouth daily., Disp: 30 tablet, Rfl: 0   [START ON 07/18/2023] lisdexamfetamine (VYVANSE) 30 MG chewable tablet, Chew 1 tablet (30 mg total) by mouth daily., Disp: 30 tablet, Rfl: 0   Norgestim-Eth Estrad Triphasic (NORGESTIMATE-ETHINYL ESTRADIOL TRIPHASIC) 0.18/0.215/0.25 MG-25 MCG tab, Take 1 tablet by mouth daily., Disp: 84 tablet, Rfl: 4   propranolol (INDERAL) 10 MG tablet, TAKE 1-2 TABLETS BY MOUTH UP TO THREE TIME, Disp: 540 tablet, Rfl: 1   rOPINIRole (REQUIP) 0.25 MG tablet, TAKE 1 TO 3 TABLETS BY MOUTH AT BEDTIME FOR RESTLESS LEG., Disp: 270 tablet, Rfl: 1   trimethoprim-polymyxin b (POLYTRIM) ophthalmic solution, Place 1 drop into the left eye every 4 (four) hours., Disp: 10 mL, Rfl: 0  EXAM:  VITALS per patient if applicable: Ht 5\' 4"  (1.626 m)   Wt 142 lb (64.4 kg)   BMI 24.37 kg/m   GENERAL: alert, oriented, appears well and in no acute distress  HEENT: atraumatic, conjunttiva clear, no obvious abnormalities on inspection of external nose and ears  NECK: normal movements of the head and neck  LUNGS: on inspection no signs of respiratory distress, breathing rate appears normal, no obvious gross SOB, gasping or wheezing  CV: no obvious cyanosis  MS: moves all visible extremities without noticeable abnormality  PSYCH/NEURO: pleasant and cooperative, no obvious depression or anxiety, speech and thought processing grossly intact  ASSESSMENT AND PLAN:  Discussed the following assessment and plan:  Conjunctivitis Likely viral in nature however 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  Reviewed general hygiene to keep from spreading.  Red flags reviewed.      I discussed the assessment and treatment plan with the patient. The patient was provided an opportunity to ask questions and all were answered. The patient agreed with the plan and demonstrated an understanding of the instructions.   The patient was  advised to call back or seek an in-person evaluation if the symptoms worsen or if the condition fails to improve as anticipated.    Everrett Coombe, DO

## 2023-05-26 NOTE — Assessment & Plan Note (Signed)
 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  Reviewed general hygiene to keep from spreading.  Red flags reviewed.

## 2023-05-26 NOTE — Progress Notes (Signed)
 Left eye Redness, occasionally itching, discomfort. Works in [employer].   [facility] in [place]

## 2023-09-17 ENCOUNTER — Other Ambulatory Visit: Payer: Self-pay | Admitting: Physician Assistant

## 2023-09-17 DIAGNOSIS — F419 Anxiety disorder, unspecified: Secondary | ICD-10-CM

## 2023-09-22 NOTE — Telephone Encounter (Signed)
 Please advise on refill request

## 2023-10-16 ENCOUNTER — Encounter: Payer: Self-pay | Admitting: Physician Assistant

## 2023-10-16 DIAGNOSIS — F900 Attention-deficit hyperactivity disorder, predominantly inattentive type: Secondary | ICD-10-CM

## 2023-10-16 MED ORDER — LISDEXAMFETAMINE DIMESYLATE 30 MG PO CHEW
30.0000 mg | CHEWABLE_TABLET | Freq: Every day | ORAL | 0 refills | Status: DC
Start: 2023-10-16 — End: 2023-11-20

## 2023-10-16 NOTE — Telephone Encounter (Signed)
 Requesting rx rf of Vyanse 30mg   Last written 04/18/225 Last OV 05/07/2023 Upcoming appt = none

## 2023-11-02 ENCOUNTER — Encounter: Payer: Self-pay | Admitting: Physician Assistant

## 2023-11-02 DIAGNOSIS — Z13220 Encounter for screening for lipoid disorders: Secondary | ICD-10-CM

## 2023-11-02 DIAGNOSIS — Z1321 Encounter for screening for nutritional disorder: Secondary | ICD-10-CM

## 2023-11-02 DIAGNOSIS — R5383 Other fatigue: Secondary | ICD-10-CM

## 2023-11-06 LAB — CBC WITH DIFFERENTIAL/PLATELET
Basophils Absolute: 0 x10E3/uL (ref 0.0–0.2)
Basos: 1 %
EOS (ABSOLUTE): 0.1 x10E3/uL (ref 0.0–0.4)
Eos: 2 %
Hematocrit: 39.6 % (ref 34.0–46.6)
Hemoglobin: 13 g/dL (ref 11.1–15.9)
Immature Grans (Abs): 0 x10E3/uL (ref 0.0–0.1)
Immature Granulocytes: 0 %
Lymphocytes Absolute: 2.3 x10E3/uL (ref 0.7–3.1)
Lymphs: 35 %
MCH: 31.6 pg (ref 26.6–33.0)
MCHC: 32.8 g/dL (ref 31.5–35.7)
MCV: 96 fL (ref 79–97)
Monocytes Absolute: 0.4 x10E3/uL (ref 0.1–0.9)
Monocytes: 6 %
Neutrophils Absolute: 3.7 x10E3/uL (ref 1.4–7.0)
Neutrophils: 56 %
Platelets: 334 x10E3/uL (ref 150–450)
RBC: 4.12 x10E6/uL (ref 3.77–5.28)
RDW: 12.5 % (ref 11.7–15.4)
WBC: 6.5 x10E3/uL (ref 3.4–10.8)

## 2023-11-06 LAB — CMP14+EGFR
ALT: 13 IU/L (ref 0–32)
AST: 16 IU/L (ref 0–40)
Albumin: 4.2 g/dL (ref 4.0–5.0)
Alkaline Phosphatase: 53 IU/L (ref 44–121)
BUN/Creatinine Ratio: 12 (ref 9–23)
BUN: 9 mg/dL (ref 6–20)
Bilirubin Total: 0.3 mg/dL (ref 0.0–1.2)
CO2: 19 mmol/L — ABNORMAL LOW (ref 20–29)
Calcium: 9.2 mg/dL (ref 8.7–10.2)
Chloride: 104 mmol/L (ref 96–106)
Creatinine, Ser: 0.74 mg/dL (ref 0.57–1.00)
Globulin, Total: 2.3 g/dL (ref 1.5–4.5)
Glucose: 82 mg/dL (ref 70–99)
Potassium: 4 mmol/L (ref 3.5–5.2)
Sodium: 136 mmol/L (ref 134–144)
Total Protein: 6.5 g/dL (ref 6.0–8.5)
eGFR: 116 mL/min/1.73 (ref >59)

## 2023-11-06 LAB — VITAMIN D 25 HYDROXY (VIT D DEFICIENCY, FRACTURES): Vit D, 25-Hydroxy: 24.6 ng/mL — ABNORMAL LOW (ref 30.0–100.0)

## 2023-11-06 LAB — LIPID PANEL
Chol/HDL Ratio: 3 ratio (ref 0.0–4.4)
Cholesterol, Total: 199 mg/dL (ref 100–199)
HDL: 66 mg/dL (ref >39)
LDL Chol Calc (NIH): 119 mg/dL — ABNORMAL HIGH (ref 0–99)
Triglycerides: 75 mg/dL (ref 0–149)
VLDL Cholesterol Cal: 14 mg/dL (ref 5–40)

## 2023-11-06 LAB — IRON,TIBC AND FERRITIN PANEL
Ferritin: 31 ng/mL (ref 15–150)
Iron Saturation: 17 % (ref 15–55)
Iron: 69 ug/dL (ref 27–159)
Total Iron Binding Capacity: 410 ug/dL (ref 250–450)
UIBC: 341 ug/dL (ref 131–425)

## 2023-11-06 LAB — TSH+FREE T4
Free T4: 1.23 ng/dL (ref 0.82–1.77)
TSH: 1.5 u[IU]/mL (ref 0.450–4.500)

## 2023-11-06 LAB — MAGNESIUM: Magnesium: 2 mg/dL (ref 1.6–2.3)

## 2023-11-06 LAB — B12 AND FOLATE PANEL
Folate: 4.5 ng/mL (ref >3.0)
Vitamin B-12: 365 pg/mL (ref 232–1245)

## 2023-11-11 ENCOUNTER — Ambulatory Visit: Payer: Self-pay | Admitting: Physician Assistant

## 2023-11-11 DIAGNOSIS — E559 Vitamin D deficiency, unspecified: Secondary | ICD-10-CM | POA: Insufficient documentation

## 2023-11-11 DIAGNOSIS — E538 Deficiency of other specified B group vitamins: Secondary | ICD-10-CM | POA: Insufficient documentation

## 2023-11-11 NOTE — Progress Notes (Signed)
 Janel,   LDL, bad cholesterol, not optimal range but good.  Your HDL, good cholesterol, looks really good.  Vitamin D  is low. Increase by 2000 units a day. Take with dairy for better absorption. Hemoglobin looks good.  B12 low normal. I would take at least daily.  Thyroid looks good.   Recheck vitamin D  and b12 in 3 months.

## 2023-11-20 ENCOUNTER — Encounter: Payer: Self-pay | Admitting: Physician Assistant

## 2023-11-20 DIAGNOSIS — F900 Attention-deficit hyperactivity disorder, predominantly inattentive type: Secondary | ICD-10-CM

## 2023-11-20 NOTE — Telephone Encounter (Signed)
 Requesting rx rf of Vyvanse  30mg   Last written 10/16/2023 Last OV 05/07/2023 Upcoming appt 11/25/2023

## 2023-11-21 MED ORDER — LISDEXAMFETAMINE DIMESYLATE 30 MG PO CHEW: 30.0000 mg | CHEWABLE_TABLET | Freq: Every day | ORAL | 0 refills | Status: DC

## 2023-11-24 ENCOUNTER — Ambulatory Visit: Payer: Self-pay

## 2023-11-24 NOTE — Telephone Encounter (Signed)
 FYI Only or Action Required?: FYI only for provider.  Patient was last seen in primary care on 05/26/2023 by Alvia Bring, DO.  Called Nurse Triage reporting swelling.  Symptoms began a week ago.  Interventions attempted: Nothing.  Symptoms are: unchanged.  Triage Disposition: Home Care  Patient/caregiver understands and will follow disposition?:       Copied from CRM #[id number]. Topic: Clinical - Red Word Triage >> Nov 24, 2023 12:10 PM Susanna ORN wrote: Red Word that prompted transfer to Nurse Triage: Patient states she's having a clicking feeling and swelling in her right arm. Currently has appt to see Dr. Antoniette tomorrow. Reason for Disposition  [1] Localized swelling (e.g., small area of puffy or swollen skin) AND [2] itchy  Answer Assessment - Initial Assessment Questions Patient called to report wrist pain/swelling for tomorrow's appt. Advised call back or UC/ED if symptoms worsen.  1. ONSET: When did the swelling start? (e.g., minutes, hours, days)     Monday last week, feels like overstregthing rubber band, crepitits, above wrist ont the thumb side 2. LOCATION: What part of the arm is swollen?  Are both arms swollen or just one arm?     3 inches above right wrist, for 1 week 3. SEVERITY: How bad is the swelling? (e.g., localized; mild, moderate, severe)     Mild swelling above wrist only 4. REDNESS: Is there redness or signs of infection?     no 5. PAIN: Is the swelling painful to touch? If Yes, ask: How painful is it?   (Scale 1-10; mild, moderate or severe)     Only with movement, just feels tight at rest; 6/10 6. FEVER: Do you have a fever? If Yes, ask: What is it, how was it measured, and when did it start?      no 7. CAUSE: What do you think is causing the arm swelling?     Overuse of arm, everytime I work it hurts 8. MEDICAL HISTORY: Do you have a history of heart failure, kidney disease, liver failure, or cancer?     no 9. RECURRENT  SYMPTOM: Have you had arm swelling before? If Yes, ask: When was the last time? What happened that time?     First time 10. OTHER SYMPTOMS: Do you have any other symptoms? (e.g., chest pain, difficulty breathing)       Denies chest pain, difficulty breathing, numbness/tingling  Protocols used: Arm Swelling and Edema-A-AH

## 2023-11-24 NOTE — Telephone Encounter (Signed)
 Patient scheduled 11/25/2023 with Vermell Bologna, PA

## 2023-11-25 ENCOUNTER — Encounter: Payer: Self-pay | Admitting: Physician Assistant

## 2023-11-25 ENCOUNTER — Ambulatory Visit (INDEPENDENT_AMBULATORY_CARE_PROVIDER_SITE_OTHER): Admitting: Physician Assistant

## 2023-11-25 DIAGNOSIS — F900 Attention-deficit hyperactivity disorder, predominantly inattentive type: Secondary | ICD-10-CM | POA: Diagnosis not present

## 2023-11-25 MED ORDER — LISDEXAMFETAMINE DIMESYLATE 30 MG PO CHEW: 30.0000 mg | CHEWABLE_TABLET | Freq: Every day | ORAL | 0 refills | Status: AC

## 2023-11-25 MED ORDER — LISDEXAMFETAMINE DIMESYLATE 30 MG PO CHEW: 30.0000 mg | CHEWABLE_TABLET | Freq: Every day | ORAL | 0 refills | Status: DC

## 2023-11-25 NOTE — Progress Notes (Signed)
 Established Patient Office Visit  Subjective   Patient ID: Sherry Yang, female    DOB: 2000-06-02  Age: 23 y.o. MRN: [id number]   HPI Pt is a 23 yo female with ADHD who presents to the clinic for vyvanse  refills. She is doing great. No concerns with medications. She denies any increase in anxiety, elevated BP or problems sleeping. Work is going well.    ROS See HPI.    Objective:     BP 126/83   Pulse 91   Ht 5' 4 (1.626 m)   Wt 150 lb (68 kg)   SpO2 99%   BMI 25.75 kg/m  BP Readings from Last 3 Encounters:  11/25/23 126/83  05/07/23 120/73  02/25/23 101/64   Wt Readings from Last 3 Encounters:  11/25/23 150 lb (68 kg)  05/26/23 142 lb (64.4 kg)  05/07/23 143 lb (64.9 kg)    ..    11/25/2023   11:40 AM 02/25/2023    2:10 PM 03/19/2022    2:34 PM 11/23/2021    2:01 PM 11/23/2021    1:58 PM  Depression screen PHQ 2/9  Decreased Interest 0 0 2 2 2   Down, Depressed, Hopeless 0 0 1 2 1   PHQ - 2 Score 0 0 3 4 3   Altered sleeping 1 1 1 3 3   Tired, decreased energy 1 3 1 3 3   Change in appetite 0 1 0 0 0  Feeling bad or failure about yourself  0 0 0 1 1  Trouble concentrating 0 1 1 1 1   Moving slowly or fidgety/restless 0 0 0 0 0  Suicidal thoughts 0 0 0 0 0  PHQ-9 Score 2 6 6 12 11   Difficult doing work/chores Not difficult at all Somewhat difficult Somewhat difficult Somewhat difficult    ..    11/25/2023   11:40 AM 02/25/2023    2:11 PM 03/19/2022    2:36 PM 11/23/2021    2:00 PM  GAD 7 : Generalized Anxiety Score  Nervous, Anxious, on Edge 1 3 2 3   Control/stop worrying 1 3 2 3   Worry too much - different things 1 3 2 3   Trouble relaxing 0 2 1 3   Restless 0 1 0 2  Easily annoyed or irritable 1 0 1 2  Afraid - awful might happen 0 0 0 1  Total GAD 7 Score 4 12 8 17   Anxiety Difficulty Somewhat difficult Very difficult Somewhat difficult Somewhat difficult      Physical Exam Constitutional:      Appearance: Normal appearance.  HENT:      Head: Normocephalic.  Cardiovascular:     Rate and Rhythm: Normal rate and regular rhythm.  Pulmonary:     Effort: Pulmonary effort is normal.     Breath sounds: Normal breath sounds.  Neurological:     Mental Status: She is alert.  Psychiatric:        Mood and Affect: Mood normal.         Assessment & Plan:  SABRASABRAKirsten was seen today for medical management of chronic issues.  Diagnoses and all orders for this visit:  Attention deficit hyperactivity disorder (ADHD), predominantly inattentive type -     lisdexamfetamine (VYVANSE ) 30 MG chewable tablet; Chew 1 tablet (30 mg total) by mouth daily. -     lisdexamfetamine (VYVANSE ) 30 MG chewable tablet; Chew 1 tablet (30 mg total) by mouth daily. -     lisdexamfetamine (VYVANSE ) 30 MG chewable tablet; Chew 1  tablet (30 mg total) by mouth daily.   Vitals look great .SABRAPDMP reviewed during this encounter. No concerns PHQ/GAD no concerns numbers look great Vyvanse  refilled Follow up in 6 months   Vermell Bologna, PA-C

## 2024-02-10 ENCOUNTER — Other Ambulatory Visit: Payer: Self-pay | Admitting: Physician Assistant

## 2024-02-10 DIAGNOSIS — F419 Anxiety disorder, unspecified: Secondary | ICD-10-CM

## 2024-02-12 ENCOUNTER — Encounter: Payer: Self-pay | Admitting: Physician Assistant

## 2024-02-12 DIAGNOSIS — F419 Anxiety disorder, unspecified: Secondary | ICD-10-CM

## 2024-02-13 MED ORDER — PROPRANOLOL HCL 10 MG PO TABS: ORAL_TABLET | ORAL | 1 refills | Status: AC

## 2024-02-13 NOTE — Addendum Note (Signed)
 Addended by: ANTONIETTE VERMELL CROME on: 02/13/2024 10:29 AM   Modules accepted: Orders

## 2024-02-28 ENCOUNTER — Other Ambulatory Visit: Payer: Self-pay

## 2024-02-28 ENCOUNTER — Ambulatory Visit
Admission: RE | Admit: 2024-02-28 | Discharge: 2024-02-28 | Disposition: A | Discharge status: Home / Self Care | Source: Ambulatory Visit | Attending: Internal Medicine | Admitting: Internal Medicine

## 2024-02-28 VITALS — BP 106/72 | HR 68 | Temp 98.2°F | Resp 16

## 2024-02-28 DIAGNOSIS — J101 Influenza due to other identified influenza virus with other respiratory manifestations: Secondary | ICD-10-CM | POA: Diagnosis not present

## 2024-02-28 DIAGNOSIS — R051 Acute cough: Secondary | ICD-10-CM | POA: Diagnosis not present

## 2024-02-28 DIAGNOSIS — J029 Acute pharyngitis, unspecified: Secondary | ICD-10-CM

## 2024-02-28 LAB — POCT RAPID STREP A (OFFICE): Rapid Strep A Screen: NEGATIVE

## 2024-02-28 LAB — POC SOFIA SARS ANTIGEN FIA: SARS Coronavirus 2 Ag: NEGATIVE

## 2024-02-28 LAB — POCT INFLUENZA A/B
Influenza A, POC: NEGATIVE
Influenza B, POC: POSITIVE — AB

## 2024-02-28 MED ORDER — BENZONATATE 100 MG PO CAPS: 100.0000 mg | ORAL_CAPSULE | Freq: Three times a day (TID) | ORAL | 0 refills | Status: DC | PRN

## 2024-02-28 MED ORDER — FLUTICASONE PROPIONATE 50 MCG/ACT NA SUSP: 1.0000 | Freq: Every day | NASAL | 0 refills | Status: DC

## 2024-02-28 NOTE — Discharge Instructions (Signed)
 You tested positive for the flu.  You are outside the treatment window for Tamiflu.  Therefore, we will treat symptomatically and this should run its course.  I have prescribed you medications to help with symptoms.  Ensure you are drinking plenty of water and resting.  Follow-up if any symptoms persist or worsen.

## 2024-02-28 NOTE — ED Provider Notes (Signed)
 [facility]    CSN: [id number] Arrival date & time: 02/28/24  1209      History   Chief Complaint Chief Complaint  Patient presents with   URI    Entered by patient    HPI Sherry Yang is a 23 y.o. female.   Patient presents with 4-day history of nasal congestion, cough, sore throat, itchy ears.  Denies fever.  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  Patient is taking ibuprofen  for symptoms.  Denies history of asthma.   URI   Past Medical History:  Diagnosis Date   ADHD    Anxiety    Chronic gastritis    Depression    Duodenitis determined by biopsy    Inflammatory bowel disease    Irregular menses    Obesity, pediatric    Snoring     Patient Active Problem List   Diagnosis Date Noted   Low serum vitamin B12 11/11/2023   Vitamin D  insufficiency 11/11/2023   Conjunctivitis 05/26/2023   Anterior shin splints 05/07/2023   Acute cough 04/08/2022   Blast injury of right hand 10/15/2021   Stress 06/19/2021   Family history of restless legs syndrome 01/31/2021   RLS (restless legs syndrome) 01/31/2021   Family history of Parkinson disease 01/31/2021   COVID-19 virus infection 11/28/2020   Vulvar irritation 11/15/2020   Tonsillith 08/09/2020   Birth control counseling 08/09/2020   Nodule of skin of left lower leg 08/09/2020   GAD (generalized anxiety disorder) 09/21/2019   Depression, recurrent 09/21/2019   Trouble in sleeping 09/21/2019   Anxiety 02/03/2019   Axillary adenopathy 11/30/2018   Need for HPV vaccination 06/25/2017   History of syncope 03/07/2017   Postural dizziness with near syncope 03/07/2017   Acne vulgaris 03/07/2017   Attention deficit hyperactivity disorder (ADHD), predominantly inattentive type 11/20/2016   Chronic midline thoracic back pain 11/20/2016   History of childhood obesity 11/20/2016   Gastritis without bleeding 08/16/2015   Duodenitis 07/05/2015   Irregular menses  11/26/2014   Moderate episode of recurrent major depressive disorder (HCC) 11/26/2014   IBD (inflammatory bowel disease) 03/30/2014    Past Surgical History:  Procedure Laterality Date   COLONOSCOPY WITH ESOPHAGOGASTRODUODENOSCOPY (EGD)  05/2015    OB History   No obstetric history on file.      Home Medications    Prior to Admission medications   Medication Sig Start Date End Date Taking? Authorizing Provider  benzonatate  (TESSALON ) 100 MG capsule Take 1 capsule (100 mg total) by mouth every 8 (eight) hours as needed for cough. 02/28/24  Yes Ersie Savino E, FNP  fluticasone (FLONASE) 50 MCG/ACT nasal spray Place 1 spray into both nostrils daily. 02/28/24  Yes Latronda Spink, Darryle BRAVO, FNP  clonazePAM  (KLONOPIN ) 1 MG tablet Take 1 tablet (1 mg total) by mouth 2 (two) times daily as needed for anxiety. 03/19/22   Breeback, Jade L, PA-C  ibuprofen  (ADVIL ) 800 MG tablet Take 1 tablet (800 mg total) by mouth every 8 (eight) hours as needed. 11/06/18   Breeback, Jade L, PA-C  lisdexamfetamine (VYVANSE ) 30 MG chewable tablet Chew 1 tablet (30 mg total) by mouth daily. 12/20/23   Breeback, Jade L, PA-C  lisdexamfetamine (VYVANSE ) 30 MG chewable tablet Chew 1 tablet (30 mg total) by mouth daily. 01/17/24   Breeback, Jade L, PA-C  lisdexamfetamine (VYVANSE ) 30 MG chewable tablet Chew 1 tablet (30 mg total) by mouth daily. 02/15/24   Breeback, Jade L, PA-C  Norgestim-Eth Estrad Triphasic (NORGESTIMATE -ETHINYL ESTRADIOL  TRIPHASIC) 0.18/0.215/0.25 MG-25 MCG tab Take 1 tablet by mouth daily. 02/25/23   Breeback, Vermell CROME, PA-C  propranolol  (INDERAL ) 10 MG tablet TAKE 1-2 TABLETS BY MOUTH up to three time 02/13/24   Breeback, Jade L, PA-C  rOPINIRole  (REQUIP ) 0.25 MG tablet TAKE 1 TO 3 TABLETS BY MOUTH AT BEDTIME FOR RESTLESS LEG. 02/25/23   Antoniette Vermell CROME, PA-C    Family History Family History  Problem Relation Age of Onset   Hypertension Father    Irritable bowel syndrome Father    Colon polyps Mother      Social History Social History   Tobacco Use   Smoking status: Never   Smokeless tobacco: Never  Vaping Use   Vaping status: Never Used  Substance Use Topics   Alcohol use: No   Drug use: No     Allergies   Viibryd  [vilazodone  hcl]   Review of Systems Review of Systems Per HPI  Physical Exam Triage Vital Signs ED Triage Vitals  Encounter Vitals Group     BP 02/28/24 1225 106/72     Girls Systolic BP Percentile --      Girls Diastolic BP Percentile --      Boys Systolic BP Percentile --      Boys Diastolic BP Percentile --      Pulse Rate 02/28/24 1225 68     Resp 02/28/24 1225 16     Temp 02/28/24 1225 98.2 F (36.8 C)     Temp src --      SpO2 02/28/24 1225 98 %     Weight --      Height --      Head Circumference --      Peak Flow --      Pain Score 02/28/24 1229 3     Pain Loc --      Pain Education --      Exclude from Growth Chart --    No data found.  Updated Vital Signs BP 106/72   Pulse 68   Temp 98.2 F (36.8 C)   Resp 16   LMP 02/16/2024 (Approximate)   SpO2 98%   Visual Acuity Right Eye Distance:   Left Eye Distance:   Bilateral Distance:    Right Eye Near:   Left Eye Near:    Bilateral Near:     Physical Exam Constitutional:      General: She is not in acute distress.    Appearance: Normal appearance. She is not toxic-appearing or diaphoretic.  HENT:     Head: Normocephalic and atraumatic.     Right Ear: Ear canal normal. A middle ear effusion is present. Tympanic membrane is not perforated, erythematous or bulging.     Left Ear: Ear canal normal. A middle ear effusion is present. Tympanic membrane is not perforated, erythematous or bulging.     Nose: Congestion present.     Mouth/Throat:     Mouth: Mucous membranes are moist.     Pharynx: Posterior oropharyngeal erythema present. No pharyngeal swelling or oropharyngeal exudate.     Tonsils: No tonsillar exudate or tonsillar abscesses.  Eyes:     Extraocular Movements:  Extraocular movements intact.     Conjunctiva/sclera: Conjunctivae normal.     Pupils: Pupils are equal, round, and reactive to light.  Cardiovascular:     Rate and Rhythm: Normal rate and regular rhythm.     Pulses: Normal pulses.     Heart sounds: Normal heart sounds.  Pulmonary:     Effort: Pulmonary effort is normal. No respiratory distress.     Breath sounds: Normal breath sounds. No stridor. No wheezing, rhonchi or rales.  Musculoskeletal:        General: Normal range of motion.     Cervical back: Normal range of motion.  Skin:    General: Skin is warm and dry.  Neurological:     General: No focal deficit present.     Mental Status: She is alert and oriented to person, place, and time. Mental status is at baseline.  Psychiatric:        Mood and Affect: Mood normal.        Behavior: Behavior normal.      UC Treatments / Results  Labs (all labs ordered are listed, but only abnormal results are displayed) Labs Reviewed  POCT INFLUENZA A/B - Abnormal; Notable for the following components:      Result Value   Influenza B, POC Positive (*)    All other components within normal limits  CULTURE, GROUP A STREP ([facility])  POC SOFIA SARS ANTIGEN FIA  POCT RAPID STREP A (OFFICE)    EKG   Radiology No results found.  Procedures Procedures (including critical care time)  Medications Ordered in UC Medications - No data to display  Initial Impression / Assessment and Plan / UC Course  I have reviewed the triage vital signs and the nursing notes.  Pertinent labs & imaging results that were available during my care of the patient were reviewed by me and considered in my medical decision making (see chart for details).     Patient tested positive for influenza B.  She is outside the treatment window for Tamiflu.  Will treat symptomatically with benzonatate  and Flonase.  Also advised patient of supportive care, fluids, rest.  Rapid strep completed that was negative.  Throat  culture pending.  Advised strict follow-up precautions.  Patient verbalized understanding and was agreeable with plan. Final Clinical Impressions(s) / UC Diagnoses   Final diagnoses:  Acute cough  Influenza B  Sore throat     Discharge Instructions      You tested positive for the flu.  You are outside the treatment window for Tamiflu.  Therefore, we will treat symptomatically and this should run its course.  I have prescribed you medications to help with symptoms.  Ensure you are drinking plenty of water and resting.  Follow-up if any symptoms persist or worsen.     ED Prescriptions     Medication Sig Dispense Auth. Provider   benzonatate  (TESSALON ) 100 MG capsule Take 1 capsule (100 mg total) by mouth every 8 (eight) hours as needed for cough. 21 capsule Bivalve, Konya Fauble E, FNP   fluticasone [facility]) 50 MCG/ACT nasal spray Place 1 spray into both nostrils daily. 16 g Hazen Darryle BRAVO, [place]      PDMP not reviewed this encounter.   Hazen Darryle BRAVO, [place] 02/28/24 1312

## 2024-02-28 NOTE — ED Triage Notes (Signed)
 Has been sick since Wednesday, has had nasal congestion, cough, runny nose, sneezing, ears itching, weakness. No fever. Has had ibuprofen .

## 2024-02-29 ENCOUNTER — Telehealth: Payer: Self-pay

## 2024-03-01 LAB — CULTURE, GROUP A STREP (THRC)

## 2024-03-02 ENCOUNTER — Ambulatory Visit
Admission: EM | Admit: 2024-03-02 | Discharge: 2024-03-02 | Disposition: A | Discharge status: Home / Self Care | Attending: Family Medicine | Admitting: Family Medicine

## 2024-03-02 ENCOUNTER — Telehealth: Payer: Self-pay | Admitting: Internal Medicine

## 2024-03-02 ENCOUNTER — Ambulatory Visit: Payer: Self-pay | Admitting: Internal Medicine

## 2024-03-02 ENCOUNTER — Other Ambulatory Visit: Payer: Self-pay

## 2024-03-02 DIAGNOSIS — J020 Streptococcal pharyngitis: Secondary | ICD-10-CM | POA: Diagnosis not present

## 2024-03-02 MED ORDER — PREDNISONE 20 MG PO TABS: 40.0000 mg | ORAL_TABLET | Freq: Every day | ORAL | 0 refills | Status: AC

## 2024-03-02 MED ORDER — FLUCONAZOLE 150 MG PO TABS: 150.0000 mg | ORAL_TABLET | Freq: Every day | ORAL | 0 refills | Status: AC

## 2024-03-02 MED ORDER — AMOXICILLIN 500 MG PO CAPS: 500.0000 mg | ORAL_CAPSULE | Freq: Two times a day (BID) | ORAL | 0 refills | Status: AC

## 2024-03-02 NOTE — ED Provider Notes (Signed)
 [facility]    CSN: [id number] Arrival date & time: 03/02/24  1208      History   Chief Complaint Chief Complaint  Patient presents with   Cough    HPI Sherry Yang is a 23 y.o. female.   Patient was seen last week.  She was diagnosed with influenza B.  She was out of the window for treat with Tamiflu so was treated symptomatically.  A throat culture was done and grew strep.  It is not strep A.  With follow-up call it was identified she continues to have a sore throat.  Patient is here for repeat evaluation. Initially stated that she wanted an injection.  This is upon the advice of her mother.  After discussion she agreed that oral antibiotics are fine She is requesting a steroid.  She states she thinks it will help with the inflammation in her airways and throat. Patient states she always gets yeast infections with antibiotics. Strep infections discussed    Past Medical History:  Diagnosis Date   ADHD    Anxiety    Chronic gastritis    Depression    Duodenitis determined by biopsy    Inflammatory bowel disease    Irregular menses    Obesity, pediatric    Snoring     Patient Active Problem List   Diagnosis Date Noted   Low serum vitamin B12 11/11/2023   Vitamin D  insufficiency 11/11/2023   Conjunctivitis 05/26/2023   Anterior shin splints 05/07/2023   Acute cough 04/08/2022   Blast injury of right hand 10/15/2021   Stress 06/19/2021   Family history of restless legs syndrome 01/31/2021   RLS (restless legs syndrome) 01/31/2021   Family history of Parkinson disease 01/31/2021   COVID-19 virus infection 11/28/2020   Vulvar irritation 11/15/2020   Tonsillith 08/09/2020   Birth control counseling 08/09/2020   Nodule of skin of left lower leg 08/09/2020   GAD (generalized anxiety disorder) 09/21/2019   Depression, recurrent 09/21/2019   Trouble in sleeping 09/21/2019   Anxiety 02/03/2019   Axillary adenopathy 11/30/2018   Need for HPV vaccination  06/25/2017   History of syncope 03/07/2017   Postural dizziness with near syncope 03/07/2017   Acne vulgaris 03/07/2017   Attention deficit hyperactivity disorder (ADHD), predominantly inattentive type 11/20/2016   Chronic midline thoracic back pain 11/20/2016   History of childhood obesity 11/20/2016   Gastritis without bleeding 08/16/2015   Duodenitis 07/05/2015   Irregular menses 11/26/2014   Moderate episode of recurrent major depressive disorder (HCC) 11/26/2014   IBD (inflammatory bowel disease) 03/30/2014    Past Surgical History:  Procedure Laterality Date   COLONOSCOPY WITH ESOPHAGOGASTRODUODENOSCOPY (EGD)  05/2015    OB History   No obstetric history on file.      Home Medications    Prior to Admission medications   Medication Sig Start Date End Date Taking? Authorizing Provider  fluconazole  (DIFLUCAN ) 150 MG tablet Take 1 tablet (150 mg total) by mouth daily. Repeat in 1 week if needed 03/02/24  Yes Maranda Jamee Jacob, MD  predniSONE  (DELTASONE ) 20 MG tablet Take 2 tablets (40 mg total) by mouth daily with breakfast. 03/02/24  Yes Maranda Jamee Jacob, MD  amoxicillin (AMOXIL) 500 MG capsule Take 1 capsule (500 mg total) by mouth 2 (two) times daily for 10 days. 03/02/24 03/12/24  Hazen Darryle BRAVO, FNP  ibuprofen  (ADVIL ) 800 MG tablet Take 1 tablet (800 mg total) by mouth every 8 (eight) hours as needed. 11/06/18  Breeback, Jade L, PA-C  lisdexamfetamine (VYVANSE ) 30 MG chewable tablet Chew 1 tablet (30 mg total) by mouth daily. 12/20/23   Breeback, Jade L, PA-C  lisdexamfetamine (VYVANSE ) 30 MG chewable tablet Chew 1 tablet (30 mg total) by mouth daily. 01/17/24   Breeback, Jade L, PA-C  lisdexamfetamine (VYVANSE ) 30 MG chewable tablet Chew 1 tablet (30 mg total) by mouth daily. 02/15/24   Breeback, Jade L, PA-C  Norgestim-Eth Estrad Triphasic (NORGESTIMATE -ETHINYL ESTRADIOL  TRIPHASIC) 0.18/0.215/0.25 MG-25 MCG tab Take 1 tablet by mouth daily. 02/25/23   Breeback, Jade L, PA-C   propranolol  (INDERAL ) 10 MG tablet TAKE 1-2 TABLETS BY MOUTH up to three time 02/13/24   Breeback, Jade L, PA-C  rOPINIRole  (REQUIP ) 0.25 MG tablet TAKE 1 TO 3 TABLETS BY MOUTH AT BEDTIME FOR RESTLESS LEG. 02/25/23   Antoniette Vermell CROME, PA-C    Family History Family History  Problem Relation Age of Onset   Hypertension Father    Irritable bowel syndrome Father    Colon polyps Mother     Social History Social History   Tobacco Use   Smoking status: Never   Smokeless tobacco: Never  Vaping Use   Vaping status: Never Used  Substance Use Topics   Alcohol use: No   Drug use: No     Allergies   Viibryd  [vilazodone  hcl]   Review of Systems Review of Systems See HPI  Physical Exam Triage Vital Signs ED Triage Vitals  Encounter Vitals Group     BP 03/02/24 1234 102/72     Girls Systolic BP Percentile --      Girls Diastolic BP Percentile --      Boys Systolic BP Percentile --      Boys Diastolic BP Percentile --      Pulse Rate 03/02/24 1234 92     Resp 03/02/24 1234 18     Temp 03/02/24 1234 99.3 F (37.4 C)     Temp Source 03/02/24 1234 Oral     SpO2 03/02/24 1234 98 %     Weight 03/02/24 1236 148 lb (67.1 kg)     Height 03/02/24 1236 5' 4 (1.626 m)     Head Circumference --      Peak Flow --      Pain Score 03/02/24 1236 0     Pain Loc --      Pain Education --      Exclude from Growth Chart --    No data found.  Updated Vital Signs BP 102/72 (BP Location: Right Arm)   Pulse 92   Temp 99.3 F (37.4 C) (Oral)   Resp 18   Ht 5' 4 (1.626 m)   Wt 67.1 kg   LMP 02/16/2024 (Approximate)   SpO2 98%   BMI 25.40 kg/m      Physical Exam Constitutional:      General: She is not in acute distress.    Appearance: She is well-developed. She is ill-appearing.  HENT:     Head: Normocephalic and atraumatic.     Right Ear: Tympanic membrane normal.     Left Ear: Tympanic membrane normal.     Nose: No congestion.     Mouth/Throat:     Pharynx: Posterior  oropharyngeal erythema present. No oropharyngeal exudate.     Comments: Tonsils 2-3+ large.  Erythema.  No exudate Eyes:     Conjunctiva/sclera: Conjunctivae normal.     Pupils: Pupils are equal, round, and reactive to light.  Cardiovascular:  Rate and Rhythm: Normal rate.  Pulmonary:     Effort: Pulmonary effort is normal. No respiratory distress.  Musculoskeletal:        General: Normal range of motion.     Cervical back: Normal range of motion.  Lymphadenopathy:     Cervical: Cervical adenopathy present.  Skin:    General: Skin is warm and dry.  Neurological:     Mental Status: She is alert.      UC Treatments / Results  Labs (all labs ordered are listed, but only abnormal results are displayed) Labs Reviewed - No data to display  EKG   Radiology No results found.  Procedures Procedures (including critical care time)  Medications Ordered in UC Medications - No data to display  Initial Impression / Assessment and Plan / UC Course  I have reviewed the triage vital signs and the nursing notes.  Pertinent labs & imaging results that were available during my care of the patient were reviewed by me and considered in my medical decision making (see chart for details).     Final Clinical Impressions(s) / UC Diagnoses   Final diagnoses:  Strep pharyngitis     Discharge Instructions      Start amoxicillin 2 times a day.  Take 2 doses today Take prednisone  once a day You may stop antibiotics when you are fully improved I have prescribed Diflucan  in case it is needed I have provided a work note to stay off tonight Call for problems   ED Prescriptions     Medication Sig Dispense Auth. Provider   predniSONE  (DELTASONE ) 20 MG tablet Take 2 tablets (40 mg total) by mouth daily with breakfast. 10 tablet Maranda Jamee Jacob, MD   fluconazole  (DIFLUCAN ) 150 MG tablet Take 1 tablet (150 mg total) by mouth daily. Repeat in 1 week if needed 2 tablet Maranda Jamee Jacob, MD      I have reviewed the PDMP during this encounter.   Maranda Jamee Jacob, MD 03/02/24 [phone]

## 2024-03-02 NOTE — Telephone Encounter (Signed)
 Patient spoke with nursing staff reporting that symptoms are not improving.  Given positive throat culture, will opt to treat with amoxicillin  antibiotic.  Patient requesting prednisone  steroid given symptoms are not improving.  Patient was advised that she would need to follow-up for re-evaluation if she feels the symptoms are not improving.,  Will send amoxicillin  based on strep culture.

## 2024-03-02 NOTE — ED Triage Notes (Signed)
 Pt presenting with c/o cough, chest congestion and sore throat x 4 days. Pt was seen on Saturday  at Urgent Care with results of positive Flu-b and positive strep resulted today. ABT was called in by NP however is requesting steroid and a different ABT IM. No medication taken for current symptoms.SABRA

## 2024-03-02 NOTE — Discharge Instructions (Signed)
 Start amoxicillin  2 times a day.  Take 2 doses today Take prednisone  once a day You may stop antibiotics when you are fully improved I have prescribed Diflucan  in case it is needed I have provided a work note to stay off tonight Call for problems

## 2024-03-03 NOTE — Telephone Encounter (Signed)
Called to check on patient. No answer. Left voicemail.

## 2024-03-18 ENCOUNTER — Encounter: Payer: Self-pay | Admitting: Physician Assistant

## 2024-03-18 DIAGNOSIS — F900 Attention-deficit hyperactivity disorder, predominantly inattentive type: Secondary | ICD-10-CM

## 2024-03-18 MED ORDER — LISDEXAMFETAMINE DIMESYLATE 30 MG PO CHEW: 30.0000 mg | CHEWABLE_TABLET | Freq: Every day | ORAL | 0 refills | Status: AC

## 2024-03-18 NOTE — Telephone Encounter (Signed)
..  PDMP reviewed during this encounter. Requesting 90 day rx.  No concerns. OV every 6 months.
